# Patient Record
Sex: Male | Born: 1955 | Race: Black or African American | Hispanic: No | Marital: Single | Smoking: Never smoker
Health system: Southern US, Community
[De-identification: ages and names within clinical notes are randomized; demographics above are authoritative.]

## PROBLEM LIST (undated history)

## (undated) DIAGNOSIS — F71 Moderate intellectual disabilities: Secondary | ICD-10-CM

## (undated) DIAGNOSIS — F419 Anxiety disorder, unspecified: Secondary | ICD-10-CM

## (undated) DIAGNOSIS — M6240 Contracture of muscle, unspecified site: Secondary | ICD-10-CM

## (undated) DIAGNOSIS — R1310 Dysphagia, unspecified: Secondary | ICD-10-CM

## (undated) DIAGNOSIS — Z8701 Personal history of pneumonia (recurrent): Secondary | ICD-10-CM

## (undated) DIAGNOSIS — G629 Polyneuropathy, unspecified: Secondary | ICD-10-CM

## (undated) DIAGNOSIS — R41841 Cognitive communication deficit: Secondary | ICD-10-CM

## (undated) DIAGNOSIS — I10 Essential (primary) hypertension: Secondary | ICD-10-CM

## (undated) DIAGNOSIS — G4700 Insomnia, unspecified: Secondary | ICD-10-CM

## (undated) DIAGNOSIS — R293 Abnormal posture: Secondary | ICD-10-CM

---

## 2012-04-13 ENCOUNTER — Ambulatory Visit: Payer: Medicare Other | Attending: Internal Medicine | Admitting: Physical Therapy

## 2012-04-13 DIAGNOSIS — IMO0001 Reserved for inherently not codable concepts without codable children: Secondary | ICD-10-CM | POA: Insufficient documentation

## 2012-04-13 DIAGNOSIS — R262 Difficulty in walking, not elsewhere classified: Secondary | ICD-10-CM | POA: Insufficient documentation

## 2013-04-05 ENCOUNTER — Ambulatory Visit: Payer: Medicare Other | Attending: Internal Medicine | Admitting: Occupational Therapy

## 2013-04-05 DIAGNOSIS — F79 Unspecified intellectual disabilities: Secondary | ICD-10-CM | POA: Insufficient documentation

## 2013-04-05 DIAGNOSIS — R269 Unspecified abnormalities of gait and mobility: Secondary | ICD-10-CM | POA: Insufficient documentation

## 2013-04-05 DIAGNOSIS — IMO0001 Reserved for inherently not codable concepts without codable children: Secondary | ICD-10-CM | POA: Insufficient documentation

## 2013-04-05 DIAGNOSIS — R4189 Other symptoms and signs involving cognitive functions and awareness: Secondary | ICD-10-CM | POA: Insufficient documentation

## 2016-11-07 ENCOUNTER — Other Ambulatory Visit (HOSPITAL_COMMUNITY): Payer: Self-pay | Admitting: Internal Medicine

## 2016-11-07 DIAGNOSIS — I639 Cerebral infarction, unspecified: Secondary | ICD-10-CM

## 2016-11-16 ENCOUNTER — Ambulatory Visit (HOSPITAL_COMMUNITY)
Admission: RE | Admit: 2016-11-16 | Discharge: 2016-11-16 | Disposition: A | Discharge status: Home / Self Care | Payer: Medicare Other | Source: Ambulatory Visit | Attending: Internal Medicine | Admitting: Internal Medicine

## 2016-11-16 DIAGNOSIS — F79 Unspecified intellectual disabilities: Secondary | ICD-10-CM | POA: Diagnosis present

## 2016-11-16 DIAGNOSIS — I639 Cerebral infarction, unspecified: Secondary | ICD-10-CM | POA: Diagnosis not present

## 2016-11-16 DIAGNOSIS — R6889 Other general symptoms and signs: Secondary | ICD-10-CM | POA: Diagnosis present

## 2017-07-11 ENCOUNTER — Emergency Department (HOSPITAL_COMMUNITY): Payer: Medicare Other

## 2017-07-11 ENCOUNTER — Inpatient Hospital Stay (HOSPITAL_COMMUNITY)
Admission: EM | Admit: 2017-07-11 | Discharge: 2017-07-27 | DRG: 870 | Disposition: E | Payer: Medicare Other | Attending: Internal Medicine | Admitting: Internal Medicine

## 2017-07-11 ENCOUNTER — Encounter (HOSPITAL_COMMUNITY): Payer: Self-pay

## 2017-07-11 DIAGNOSIS — J9690 Respiratory failure, unspecified, unspecified whether with hypoxia or hypercapnia: Secondary | ICD-10-CM

## 2017-07-11 DIAGNOSIS — Z7189 Other specified counseling: Secondary | ICD-10-CM

## 2017-07-11 DIAGNOSIS — N390 Urinary tract infection, site not specified: Secondary | ICD-10-CM | POA: Diagnosis not present

## 2017-07-11 DIAGNOSIS — E860 Dehydration: Secondary | ICD-10-CM | POA: Diagnosis present

## 2017-07-11 DIAGNOSIS — A419 Sepsis, unspecified organism: Principal | ICD-10-CM | POA: Diagnosis present

## 2017-07-11 DIAGNOSIS — M24562 Contracture, left knee: Secondary | ICD-10-CM | POA: Diagnosis present

## 2017-07-11 DIAGNOSIS — Z7401 Bed confinement status: Secondary | ICD-10-CM | POA: Diagnosis not present

## 2017-07-11 DIAGNOSIS — F79 Unspecified intellectual disabilities: Secondary | ICD-10-CM | POA: Diagnosis not present

## 2017-07-11 DIAGNOSIS — G809 Cerebral palsy, unspecified: Secondary | ICD-10-CM | POA: Diagnosis present

## 2017-07-11 DIAGNOSIS — E785 Hyperlipidemia, unspecified: Secondary | ICD-10-CM | POA: Diagnosis present

## 2017-07-11 DIAGNOSIS — R400 Somnolence: Secondary | ICD-10-CM | POA: Diagnosis not present

## 2017-07-11 DIAGNOSIS — R5381 Other malaise: Secondary | ICD-10-CM | POA: Diagnosis present

## 2017-07-11 DIAGNOSIS — N179 Acute kidney failure, unspecified: Secondary | ICD-10-CM | POA: Diagnosis present

## 2017-07-11 DIAGNOSIS — R0681 Apnea, not elsewhere classified: Secondary | ICD-10-CM

## 2017-07-11 DIAGNOSIS — R4182 Altered mental status, unspecified: Secondary | ICD-10-CM | POA: Diagnosis present

## 2017-07-11 DIAGNOSIS — R1310 Dysphagia, unspecified: Secondary | ICD-10-CM | POA: Diagnosis present

## 2017-07-11 DIAGNOSIS — G9349 Other encephalopathy: Secondary | ICD-10-CM | POA: Diagnosis present

## 2017-07-11 DIAGNOSIS — Z7982 Long term (current) use of aspirin: Secondary | ICD-10-CM

## 2017-07-11 DIAGNOSIS — M24561 Contracture, right knee: Secondary | ICD-10-CM | POA: Diagnosis present

## 2017-07-11 DIAGNOSIS — Z79899 Other long term (current) drug therapy: Secondary | ICD-10-CM

## 2017-07-11 DIAGNOSIS — R569 Unspecified convulsions: Secondary | ICD-10-CM | POA: Diagnosis present

## 2017-07-11 DIAGNOSIS — E870 Hyperosmolality and hypernatremia: Secondary | ICD-10-CM | POA: Diagnosis present

## 2017-07-11 DIAGNOSIS — I469 Cardiac arrest, cause unspecified: Secondary | ICD-10-CM | POA: Diagnosis not present

## 2017-07-11 DIAGNOSIS — Z515 Encounter for palliative care: Secondary | ICD-10-CM

## 2017-07-11 DIAGNOSIS — J690 Pneumonitis due to inhalation of food and vomit: Secondary | ICD-10-CM | POA: Diagnosis not present

## 2017-07-11 DIAGNOSIS — J9601 Acute respiratory failure with hypoxia: Secondary | ICD-10-CM | POA: Diagnosis not present

## 2017-07-11 DIAGNOSIS — F71 Moderate intellectual disabilities: Secondary | ICD-10-CM | POA: Diagnosis present

## 2017-07-11 DIAGNOSIS — I959 Hypotension, unspecified: Secondary | ICD-10-CM | POA: Diagnosis present

## 2017-07-11 DIAGNOSIS — Z66 Do not resuscitate: Secondary | ICD-10-CM | POA: Diagnosis present

## 2017-07-11 DIAGNOSIS — G259 Extrapyramidal and movement disorder, unspecified: Secondary | ICD-10-CM | POA: Diagnosis present

## 2017-07-11 DIAGNOSIS — I10 Essential (primary) hypertension: Secondary | ICD-10-CM | POA: Diagnosis not present

## 2017-07-11 DIAGNOSIS — F0390 Unspecified dementia without behavioral disturbance: Secondary | ICD-10-CM | POA: Diagnosis present

## 2017-07-11 DIAGNOSIS — I214 Non-ST elevation (NSTEMI) myocardial infarction: Secondary | ICD-10-CM

## 2017-07-11 DIAGNOSIS — R319 Hematuria, unspecified: Secondary | ICD-10-CM | POA: Diagnosis present

## 2017-07-11 DIAGNOSIS — D649 Anemia, unspecified: Secondary | ICD-10-CM | POA: Diagnosis present

## 2017-07-11 DIAGNOSIS — Z8701 Personal history of pneumonia (recurrent): Secondary | ICD-10-CM

## 2017-07-11 DIAGNOSIS — E871 Hypo-osmolality and hyponatremia: Secondary | ICD-10-CM | POA: Diagnosis not present

## 2017-07-11 DIAGNOSIS — Z993 Dependence on wheelchair: Secondary | ICD-10-CM

## 2017-07-11 DIAGNOSIS — R410 Disorientation, unspecified: Secondary | ICD-10-CM

## 2017-07-11 DIAGNOSIS — M6240 Contracture of muscle, unspecified site: Secondary | ICD-10-CM | POA: Diagnosis present

## 2017-07-11 DIAGNOSIS — I639 Cerebral infarction, unspecified: Secondary | ICD-10-CM | POA: Diagnosis present

## 2017-07-11 HISTORY — DX: Polyneuropathy, unspecified: G62.9

## 2017-07-11 HISTORY — DX: Essential (primary) hypertension: I10

## 2017-07-11 HISTORY — DX: Anxiety disorder, unspecified: F41.9

## 2017-07-11 HISTORY — DX: Insomnia, unspecified: G47.00

## 2017-07-11 HISTORY — DX: Personal history of pneumonia (recurrent): Z87.01

## 2017-07-11 HISTORY — DX: Dysphagia, unspecified: R13.10

## 2017-07-11 HISTORY — DX: Cognitive communication deficit: R41.841

## 2017-07-11 HISTORY — DX: Moderate intellectual disabilities: F71

## 2017-07-11 HISTORY — DX: Contracture of muscle, unspecified site: M62.40

## 2017-07-11 HISTORY — DX: Abnormal posture: R29.3

## 2017-07-11 LAB — CBC WITH DIFFERENTIAL/PLATELET
Basophils Absolute: 0 10*3/uL (ref 0.0–0.1)
Basophils Relative: 0 %
Eosinophils Absolute: 0 10*3/uL (ref 0.0–0.7)
Eosinophils Relative: 0 %
HCT: 36.6 % — ABNORMAL LOW (ref 39.0–52.0)
HEMOGLOBIN: 11.6 g/dL — AB (ref 13.0–17.0)
LYMPHS ABS: 0.6 10*3/uL — AB (ref 0.7–4.0)
LYMPHS PCT: 9 %
MCH: 28.3 pg (ref 26.0–34.0)
MCHC: 31.7 g/dL (ref 30.0–36.0)
MCV: 89.3 fL (ref 78.0–100.0)
MONO ABS: 0.5 10*3/uL (ref 0.1–1.0)
MONOS PCT: 7 %
NEUTROS ABS: 5.5 10*3/uL (ref 1.7–7.7)
NEUTROS PCT: 84 %
Platelets: 234 10*3/uL (ref 150–400)
RBC: 4.1 MIL/uL — ABNORMAL LOW (ref 4.22–5.81)
RDW: 14.8 % (ref 11.5–15.5)
WBC: 6.6 10*3/uL (ref 4.0–10.5)

## 2017-07-11 LAB — URINALYSIS, ROUTINE W REFLEX MICROSCOPIC
Bilirubin Urine: NEGATIVE
Glucose, UA: NEGATIVE mg/dL
Ketones, ur: NEGATIVE mg/dL
NITRITE: POSITIVE — AB
PROTEIN: 30 mg/dL — AB
SPECIFIC GRAVITY, URINE: 1.014 (ref 1.005–1.030)
pH: 5 (ref 5.0–8.0)

## 2017-07-11 LAB — LACTIC ACID, PLASMA: LACTIC ACID, VENOUS: 1.1 mmol/L (ref 0.5–1.9)

## 2017-07-11 LAB — HEPATIC FUNCTION PANEL
ALBUMIN: 3.3 g/dL — AB (ref 3.5–5.0)
ALK PHOS: 167 U/L — AB (ref 38–126)
ALT: 58 U/L (ref 17–63)
AST: 36 U/L (ref 15–41)
BILIRUBIN TOTAL: 0.2 mg/dL — AB (ref 0.3–1.2)
Total Protein: 6.8 g/dL (ref 6.5–8.1)

## 2017-07-11 LAB — BASIC METABOLIC PANEL
Anion gap: 9 (ref 5–15)
BUN: 15 mg/dL (ref 6–20)
CHLORIDE: 104 mmol/L (ref 101–111)
CO2: 36 mmol/L — AB (ref 22–32)
Calcium: 9.4 mg/dL (ref 8.9–10.3)
Creatinine, Ser: 0.4 mg/dL — ABNORMAL LOW (ref 0.61–1.24)
GFR calc Af Amer: >60 mL/min (ref >60)
GFR calc non Af Amer: >60 mL/min (ref >60)
GLUCOSE: 145 mg/dL — AB (ref 65–99)
Potassium: 4.3 mmol/L (ref 3.5–5.1)
SODIUM: 149 mmol/L — AB (ref 135–145)

## 2017-07-11 LAB — MRSA PCR SCREENING: MRSA by PCR: NEGATIVE

## 2017-07-11 MED ORDER — SODIUM CHLORIDE 0.9 % IV BOLUS (SEPSIS)
1000.0000 mL | Freq: Once | INTRAVENOUS | Status: AC
  Administered 2017-07-11: 1000 mL via INTRAVENOUS

## 2017-07-11 MED ORDER — DEXTROSE 5 % IV SOLN
1.0000 g | INTRAVENOUS | Status: DC
  Administered 2017-07-12 – 2017-07-13 (×2): 1 g via INTRAVENOUS
  Filled 2017-07-11 (×2): qty 10

## 2017-07-11 MED ORDER — ACETAMINOPHEN 325 MG PO TABS: 650.0000 mg | ORAL_TABLET | Freq: Four times a day (QID) | ORAL | Status: DC | PRN

## 2017-07-11 MED ORDER — ENOXAPARIN SODIUM 40 MG/0.4ML ~~LOC~~ SOLN
40.0000 mg | SUBCUTANEOUS | Status: DC
  Administered 2017-07-11 – 2017-07-13 (×3): 40 mg via SUBCUTANEOUS
  Filled 2017-07-11 (×3): qty 0.4

## 2017-07-11 MED ORDER — CEFTRIAXONE SODIUM 1 G IJ SOLR
1.0000 g | Freq: Once | INTRAMUSCULAR | Status: AC
  Administered 2017-07-11: 1 g via INTRAVENOUS
  Filled 2017-07-11: qty 10

## 2017-07-11 MED ORDER — DOCUSATE SODIUM 100 MG PO CAPS
100.0000 mg | ORAL_CAPSULE | Freq: Two times a day (BID) | ORAL | Status: DC
  Administered 2017-07-15 – 2017-07-17 (×2): 100 mg via ORAL
  Filled 2017-07-11 (×5): qty 1

## 2017-07-11 MED ORDER — POLYETHYLENE GLYCOL 3350 17 G PO PACK: 17.0000 g | PACK | Freq: Every day | ORAL | Status: DC | PRN

## 2017-07-11 MED ORDER — ACETAMINOPHEN 650 MG RE SUPP
650.0000 mg | Freq: Four times a day (QID) | RECTAL | Status: DC | PRN
  Administered 2017-07-14 – 2017-07-18 (×4): 650 mg via RECTAL
  Filled 2017-07-11 (×4): qty 1

## 2017-07-11 MED ORDER — FLEET ENEMA 7-19 GM/118ML RE ENEM: 1.0000 | ENEMA | Freq: Once | RECTAL | Status: DC | PRN

## 2017-07-11 MED ORDER — LISINOPRIL 10 MG PO TABS
20.0000 mg | ORAL_TABLET | Freq: Every day | ORAL | Status: DC
  Administered 2017-07-12: 20 mg via ORAL
  Filled 2017-07-11 (×3): qty 2

## 2017-07-11 MED ORDER — DEXTROSE-NACL 5-0.2 % IV SOLN
INTRAVENOUS | Status: DC
  Administered 2017-07-11 – 2017-07-13 (×6): via INTRAVENOUS

## 2017-07-11 NOTE — ED Notes (Signed)
Dr Verdie Mosher aware of rectal temp

## 2017-07-11 NOTE — H&P (Signed)
[facility] History and Physical  Reginald [name] NFA:[id number] DOB: [birth year] DOA: Jul 18, 2017  Referring physician: Dr Verdie Mosher PCP: Patient, [name] Per   Chief Complaint: AMS  HPI: Reginald [name] is a 61 y.o. male with history of intellectual disability, HTN, dysphagia, muscle contractures, anxiety, PNA and polyneuropathy presented to ED today sent from [facility] nursing facility for lethargy, hypotension and decreased mobility.  Pocketing food per staff.  Had fever at the SNF.  Got Rocepin 1 gm at the SNF this am before coming here.  Also his BP's were high at the SNF.    At baseline pt is reportedly alert and gives one-word responses typically.  In ED pt is somnolent and not responding verbally.  UA shows sig pyuria and hematuria.  CXR is negative and CT head shows nothing acute.  Got 1 L IVF's in the ED.    Patient is awake, makes eye contact, smiles a bit but no verbal responses.  Doesn't follow any commands.  No family here.    No old admissions in EPIC.  Only in Care Everywhere is a dental caries procedure from 04/2014 at [facility].    ROS  n/a   Past Medical History  Past Medical History:  Diagnosis Date  . Abnormal posture   . Anxiety   . Cognitive communication deficit   . Contracture of muscle   . Contracture of muscle   . Dysphagia   . Hypertension   . Insomnia   . Moderate intellectual disabilities   . Pneumonia   . Polyneuropathy    Past Surgical History History reviewed. No pertinent surgical history. Family History No family history on file. Social History  has no tobacco, alcohol, and drug history on file. Allergies No Known Allergies Home medications Prior to Admission medications   Medication Sig Start Date End Date Taking? Authorizing Provider  ALPRAZolam Prudy Feeler) 0.25 MG tablet  18-Jul-2017   [provider]  baclofen (LIORESAL) 10 MG tablet Take 10 mg by mouth 3 (three) times daily. 06/18/17   [provider]  busPIRone (BUSPAR)  7.5 MG tablet Take 7.5 mg by mouth 2 (two) times daily. 06/15/17   [provider]  gabapentin (NEURONTIN) 600 MG tablet Take 600 mg by mouth daily. 06/05/17   [provider]  HYDROcodone-acetaminophen [facility]) 10-325 MG tablet  06/09/17   [provider]  lisinopril (PRINIVIL,ZESTRIL) 20 MG tablet Take 20 mg by mouth daily. 06/26/17   [provider]  tobramycin (TOBREX) 0.3 % ophthalmic solution 1 drop.  06/21/17   [provider]   Liver Function Tests  Recent Labs Lab 07/18/2017 1620  AST 36  ALT 58  ALKPHOS 167*  BILITOT 0.2*  PROT 6.8  ALBUMIN 3.3*   No results for input(s): LIPASE, AMYLASE in the last 168 hours. CBC  Recent Labs Lab July 18, 2017 1620  WBC 6.6  NEUTROABS 5.5  HGB 11.6*  HCT 36.6*  MCV 89.3  PLT 234   Basic Metabolic Panel  Recent Labs Lab 07-18-2017 1620  NA 149*  K 4.3  CL 104  CO2 36*  GLUCOSE 145*  BUN 15  CREATININE 0.40*  CALCIUM 9.4     Vitals:   07/18/17 1659 July 18, 2017 1700 07/18/2017 1730 07/18/2017 1834  BP:  (!) 166/81 (!) 167/92   Pulse:  92 70   Resp:  19 19   Temp:    (!) 95.4 F (35.2 C)  TempSrc:    Rectal  SpO2: 92% 94% 96%   Weight:  Exam: Gen awake, makes eye contact, repetitive mouth movements w/o verbalization, does not folllow commands No rash, cyanosis or gangrene Sclera anicteric, throat clear and quite dry  No jvd or bruits, flat neck veins, no LAN Chest clear bilat ant and post RRR no MRG Abd soft ntnd no mass or ascites +bs GU normal male, in diapers MS bilat LE contractures and L hand contracted Ext skin looks good, no decub ulcers, no LE or UE edema / no wounds Neuro is alert, as above    Home meds: -baclofen 10 tid / buspar 7.5 bid / neurontin 600 qd / norco prn/  xanax 0.25 prn -lisinopril 20 qd   Na 149  K 4.3  CO2 36  BUN 15   Cr 0.40   AG 9   Ca 9.4  Alb 3.3   LFT's ok    WBC 6k  Hb 11  plt 234 UA > hazy, 6-30 rbc/ tntc WBC/ wbc clumps/ bact few / few  epis  Head CT >  Minimal small vessel chronic ischemic changes of deep cerebral white matter.  No acute intracranial abnormalities.  No significant interval change.  CXR (independ reviewed) > minimal R base atelectasis.  I reviewed the images personally.        Assessment: 1. Altered mental status , acute on chronic - due to dehydration and UTI, possibly meds as well (multiple potentially sedating meds on med list).  Hold sedating meds, IV abx, IVF's.   2. UTI - urine Cx sent, cont IV Rocephin 3. Dehydration - IVF"s 4. Mentally retarded - no good history in chart. Reportedly speaks one word at a time at the most at facility 5. Debility - bilat LE contractures, skin in good condition 6. NOK - called guardian but it is after hours and nobody answered 7. HTN - cont acei   Plan - as above      Reginald [name] D [facility] Pager [phone]   If 7PM-7AM, please contact night-coverage [email or web address] Password [email or web address] 06/30/2017, 6:37 PM

## 2017-07-11 NOTE — ED Notes (Signed)
Attempted to call report. Was advised nurse receiving pt will call this nurse not available.

## 2017-07-11 NOTE — ED Triage Notes (Signed)
PT from [facility].  Sent to er today for lethargy, hypertension, and decreased mobility.  Reports staff noted pt has been pocketing food.  Reports temp 99.  Pt had 2 g of rocephin IM this morning and clonidine for bp 200/88.

## 2017-07-11 NOTE — H&P (Signed)
[facility] History and Physical  Reginald [name] ZOX:[id number] DOB: 12-17-1955 DOA: 07/17/2017  Referring physician: Dr Verdie Mosher PCP: Patient, [name] Per   Chief Complaint: AMS  HPI: Reginald [name] is a 61 y.o. male with history of intellectual disability, HTN, dysphagia, muscle contractures, anxiety, PNA and polyneuropathy presented to ED today sent from [facility] nursing facility for lethargy, hypotension and decreased mobility.  Pocketing food per staff.  Had fever at the SNF.  Got Rocepin 1 gm at the SNF this am before coming here.  Also his BP's were high at the SNF.    At baseline pt is reportedly alert and gives one-word responses typically.  In ED pt is somnolent and not responding verbally.  UA shows sig pyuria and hematuria.  CXR is negative and CT head shows nothing acute.  Got 1 L IVF's in the ED.    Patient is awake, makes eye contact, smiles a bit but no verbal responses.  Doesn't follow any commands.  No family here.    No old admissions in EPIC.  Only in Care Everywhere is a dental caries procedure from 04/2014 at [facility].    ROS  n/a   Past Medical History  Past Medical History:  Diagnosis Date  . Abnormal posture   . Anxiety   . Cognitive communication deficit   . Contracture of muscle   . Contracture of muscle   . Dysphagia   . Hypertension   . Insomnia   . Moderate intellectual disabilities   . Pneumonia   . Polyneuropathy    Past Surgical History History reviewed. No pertinent surgical history. Family History No family history on file. Social History  has no tobacco, alcohol, and drug history on file. Allergies No Known Allergies Home medications Prior to Admission medications   Medication Sig Start Date End Date Taking? Authorizing Provider  ALPRAZolam Prudy Feeler) 0.25 MG tablet  07/04/2017   [provider]  baclofen (LIORESAL) 10 MG tablet Take 10 mg by mouth 3 (three) times daily. 06/18/17   [provider]  busPIRone (BUSPAR)  7.5 MG tablet Take 7.5 mg by mouth 2 (two) times daily. 06/15/17   [provider]  gabapentin (NEURONTIN) 600 MG tablet Take 600 mg by mouth daily. 06/05/17   [provider]  HYDROcodone-acetaminophen [facility]) 10-325 MG tablet  06/09/17   [provider]  lisinopril (PRINIVIL,ZESTRIL) 20 MG tablet Take 20 mg by mouth daily. 06/26/17   [provider]  tobramycin (TOBREX) 0.3 % ophthalmic solution 1 drop.  06/21/17   [provider]   Liver Function Tests  Recent Labs Lab 07/09/2017 1620  AST 36  ALT 58  ALKPHOS 167*  BILITOT 0.2*  PROT 6.8  ALBUMIN 3.3*   No results for input(s): LIPASE, AMYLASE in the last 168 hours. CBC  Recent Labs Lab 07/09/2017 1620  WBC 6.6  NEUTROABS 5.5  HGB 11.6*  HCT 36.6*  MCV 89.3  PLT 234   Basic Metabolic Panel  Recent Labs Lab 07/22/2017 1620  NA 149*  K 4.3  CL 104  CO2 36*  GLUCOSE 145*  BUN 15  CREATININE 0.40*  CALCIUM 9.4     Vitals:   07/25/2017 1630 07/08/2017 1659 07/23/2017 1700 07/18/2017 1730  BP: (!) 177/85  (!) 166/81 (!) 167/92  Pulse: 88  92 70  Resp: (!) SpO2: 91% 92% 94% 96%  Weight:       Exam: Gen awake, makes eye contact, repetitive mouth  movements w/o verbalization, does not folllow commands No rash, cyanosis or gangrene Sclera anicteric, throat clear and quite dry  No jvd or bruits, flat neck veins, no LAN Chest clear bilat ant and post RRR no MRG Abd soft ntnd no mass or ascites +bs GU normal male, in diapers MS bilat LE contractures and L hand contracted Ext skin looks good, no decub ulcers, no LE or UE edema / no wounds Neuro is alert, as above    Home meds: -baclofen 10 tid / buspar 7.5 bid / neurontin 600 qd / norco prn/  xanax 0.25 prn -lisinopril 20 qd   Na 149  K 4.3  CO2 36  BUN 15   Cr 0.40   AG 9   Ca 9.4  Alb 3.3   LFT's ok    WBC 6k  Hb 11  plt 234 UA > hazy, 6-30 rbc/ tntc WBC/ wbc clumps/ bact few / few epis  Head CT >  Minimal  small vessel chronic ischemic changes of deep cerebral white matter.  No acute intracranial abnormalities.  No significant interval change.  CXR (independ reviewed) > minimal R base atelectasis.  I reviewed the images personally.        Assessment: 1. Altered mental status , acute on chronic - due to dehydration and UTI, possibly meds as well (multiple potentially sedating meds on med list).  Hold sedating meds, IV abx, IVF's.   2. UTI - urine Cx sent, cont IV Rocephin 3. Dehydration - IVF"s 4. Mentally retarded - no good history in chart. Reportedly speaks one word at a time at the most at facility 5. Debility - bilat LE contractures, skin in good condition 6. NOK - called guardian but it is after hours and nobody answered 7.   Plan - as above      Avionna Bower D [facility] Pager [phone]   If 7PM-7AM, please contact night-coverage [email or web address] Password [email or web address] Aug 05, 2017, 6:15 PM

## 2017-07-11 NOTE — ED Notes (Signed)
New diaper put on Pt

## 2017-07-11 NOTE — ED Notes (Signed)
Call from guardian checking on pt. Advised pt being admitted to hospital. Per guardian pt can speak complete sentences if he likes you but will not speak to you if he does not know you. Pt only making verbal contact w/ this nurse.

## 2017-07-11 NOTE — ED Notes (Signed)
Spoke w/ Dennie Bible at [facility] about Pt admission & normal baseline.

## 2017-07-11 NOTE — ED Notes (Signed)
Attempted to call report. Was advised nurse receiving pt will call this nurse back for report. 

## 2017-07-11 NOTE — ED Provider Notes (Signed)
[facility] MEDICAL SURGICAL UNIT Provider Note   CSN: [id number] Arrival date & time: 07/08/2017  1612     History   Chief Complaint Chief Complaint  Patient presents with  . Fatigue    HPI Reginald [name] is a 61 y.o. male.  HPI Level 5 caveat due to altered mental status 61 year old male who presents with lethargy. From [facility]. History of cognitive impairment, dementia. Noted by staff to be lethargic today with low grade temperature of 99. He has been pocketing food today which is new. Given IM rocephin empirically. Also hypertensive SBP 200s and given clonidine. Patient non-verbal, unable to provide history.   Past Medical History:  Diagnosis Date  . Abnormal posture   . Anxiety   . Cognitive communication deficit   . Contracture of muscle   . Contracture of muscle   . Dysphagia   . Hypertension   . Insomnia   . Moderate intellectual disabilities   . Pneumonia   . Polyneuropathy     Patient Active Problem List   Diagnosis Date Noted  . Intellectual disability 06/30/2017  . Altered mental status 07/22/2017  . Acute lower UTI 06/27/2017  . Dehydration 07/12/2017  . Contractures of both knees 06/27/2017  . Debility 07/20/2017  . Bedbound 07/10/2017  . Essential hypertension 07/12/2017    History reviewed. No pertinent surgical history.     Home Medications    Prior to Admission medications   Medication Sig Start Date End Date Taking? Authorizing Provider  acetaminophen (TYLENOL) 325 MG tablet Take 650 mg by mouth 3 (three) times daily.   Yes [provider]  acetaminophen (TYLENOL) 500 MG tablet Take 1,000 mg by mouth every 6 (six) hours as needed for mild pain or moderate pain.   Yes [provider]  ALPRAZolam (XANAX) 0.25 MG tablet Take 0.25 mg by mouth 2 (two) times daily.  07/10/2017  Yes [provider]  aspirin EC 81 MG tablet Take 81 mg by mouth daily.   Yes [provider]  baclofen  (LIORESAL) 10 MG tablet Take 10 mg by mouth 3 (three) times daily. 06/18/17  Yes [provider]  busPIRone (BUSPAR) 7.5 MG tablet Take 7.5 mg by mouth 2 (two) times daily. 06/15/17  Yes [provider]  cefTRIAXone (ROCEPHIN) 1 g injection Inject 2 g into the muscle once.   Yes [provider]  chlorhexidine (PERIDEX) 0.12 % solution Use as directed 15 mLs in the mouth or throat 2 (two) times daily.   Yes [provider]  cholecalciferol (VITAMIN D) 1000 units tablet Take 1,000 Units by mouth daily.   Yes [provider]  cloNIDine (CATAPRES) 0.2 MG tablet Take 0.2 mg by mouth once.   Yes [provider]  gabapentin (NEURONTIN) 600 MG tablet Take 600 mg by mouth at bedtime.  06/05/17  Yes [provider]  HYDROcodone-acetaminophen (NORCO) 10-325 MG tablet Take 1 tablet by mouth 3 (three) times daily.  06/09/17  Yes [provider]  lisinopril (PRINIVIL,ZESTRIL) 20 MG tablet Take 20 mg by mouth daily. 06/26/17  Yes [provider]  Melatonin 3 MG TABS Take 3 mg by mouth at bedtime.   Yes [provider]  Multiple Vitamin (MULTIVITAMIN WITH MINERALS) TABS tablet Take 1 tablet by mouth daily.   Yes [provider]  OXYGEN Inhale into the lungs daily.   Yes [provider]    Family History No family history on file.  Social History  Social History  Substance Use Topics  . Smoking status: Not on file  . Smokeless tobacco: Not on file  . Alcohol use Not on file     Allergies   Patient has no known allergies.   Review of Systems Review of Systems  Unable to perform ROS: Patient nonverbal     Physical Exam Updated Vital Signs BP (!) (P) 190/72 (BP Location: Left Arm)   Pulse (P) 81   Temp (!) 97.1 F (36.2 C)   Resp (P) 18   Wt (P) 54.7 kg (120 lb 11.2 oz)   SpO2 (P) 99%   Physical Exam Physical Exam  Nursing note and vitals reviewed. Constitutional: Lethargy, acutely ill  appearing, and in no acute distress Head: Normocephalic and atraumatic.  Mouth/Throat: Oropharynx is clear and dry.  Neck: Normal range of motion. Neck supple.  Cardiovascular: Normal rate and regular rhythm.   Pulmonary/Chest: Effort normal and breath sounds normal anteriorly.  Abdominal: Soft. There is no tenderness. There is no rebound and no guarding.  Musculoskeletal: No deformities. No edema Neurological: Lethargic, somnolent, does not obey commands Skin: Skin is warm and dry.  Psychiatric: Cooperative   ED Treatments / Results  Labs (all labs ordered are listed, but only abnormal results are displayed) Labs Reviewed  CBC WITH DIFFERENTIAL/PLATELET - Abnormal; Notable for the following:       Result Value   RBC 4.10 (*)    Hemoglobin 11.6 (*)    HCT 36.6 (*)    Lymphs Abs 0.6 (*)    All other components within normal limits  BASIC METABOLIC PANEL - Abnormal; Notable for the following:    Sodium 149 (*)    CO2 36 (*)    Glucose, Bld 145 (*)    Creatinine, Ser 0.40 (*)    All other components within normal limits  HEPATIC FUNCTION PANEL - Abnormal; Notable for the following:    Albumin 3.3 (*)    Alkaline Phosphatase 167 (*)    Total Bilirubin 0.2 (*)    Bilirubin, Direct <0.1 (*)    All other components within normal limits  URINALYSIS, ROUTINE W REFLEX MICROSCOPIC - Abnormal; Notable for the following:    APPearance HAZY (*)    Hgb urine dipstick MODERATE (*)    Protein, ur 30 (*)    Nitrite POSITIVE (*)    Leukocytes, UA LARGE (*)    Bacteria, UA FEW (*)    Squamous Epithelial / LPF 0-5 (*)    All other components within normal limits  URINE CULTURE  MRSA PCR SCREENING  LACTIC ACID, PLASMA  HIV ANTIBODY (ROUTINE TESTING)  BASIC METABOLIC PANEL  CBC    EKG  EKG Interpretation  Date/Time:  Tuesday July 21, 2017 16:16:24 EDT Ventricular Rate:  84 PR Interval:    QRS Duration: 103 QT Interval:  391 QTC Calculation: 463 R Axis:   111 Text  Interpretation:  Sinus rhythm Right axis deviation no prior EKG  Confirmed by Crista Curb [phone]) on July 21, 2017 5:05:17 PM       Radiology Ct Head Wo Contrast  Result Date: 07/02/2017 CLINICAL DATA:  Lethargy, hypertension, low-grade fever, decreased mobility EXAM: CT HEAD WITHOUT CONTRAST TECHNIQUE: Contiguous axial images were obtained from the base of the skull through the vertex without intravenous contrast. Sagittal and coronal MPR images reconstructed from axial data set. COMPARISON:  11/16/2016 FINDINGS: Brain: Normal ventricular morphology. No midline shift or mass effect. Minimal small vessel chronic ischemic changes of deep cerebral white matter. Otherwise  normal appearance of brain parenchyma. No intracranial hemorrhage, mass lesion or evidence of acute infarction. No extra-axial fluid collections. Vascular: Unremarkable Skull: Intact Sinuses/Orbits: Cleared Other: N/A IMPRESSION: Minimal small vessel chronic ischemic changes of deep cerebral white matter. No acute intracranial abnormalities. No significant interval change. Electronically Signed   By: Ulyses Southward M.D.   On: 07/04/2017 17:23   Dg Chest Portable 1 View  Result Date: 06/29/2017 CLINICAL DATA:  Low-grade fever, lethargy, hypertension, decreased mobility EXAM: PORTABLE CHEST 1 VIEW COMPARISON:  Portable exam 1623 hours without priors for comparison FINDINGS: Normal heart size, mediastinal contours, and pulmonary vascularity. Azygos fissure noted. Minimal RIGHT basilar atelectasis. Lungs otherwise clear. No infiltrate, pleural effusion or pneumothorax. Bones unremarkable. IMPRESSION: Minimal RIGHT basilar atelectasis. Electronically Signed   By: Ulyses Southward M.D.   On: 07/20/2017 16:40    Procedures Procedures (including critical care time)  Medications Ordered in ED Medications  lisinopril (PRINIVIL,ZESTRIL) tablet 20 mg (not administered)  enoxaparin (LOVENOX) injection 40 mg (not administered)  dextrose 5 % and 0.2 %  NaCl infusion ( Intravenous New Bag/Given 06/28/2017 2318)  acetaminophen (TYLENOL) tablet 650 mg (not administered)    Or  acetaminophen (TYLENOL) suppository 650 mg (not administered)  docusate sodium (COLACE) capsule 100 mg (100 mg Oral Not Given 07/23/2017 2215)  polyethylene glycol (MIRALAX / GLYCOLAX) packet 17 g (not administered)  sodium phosphate (FLEET) 7-19 GM/118ML enema 1 enema (not administered)  cefTRIAXone (ROCEPHIN) 1 g in dextrose 5 % 50 mL IVPB (not administered)  sodium chloride 0.9 % bolus 1,000 mL (0 mLs Intravenous Stopped 07/25/2017 1944)  cefTRIAXone (ROCEPHIN) 1 g in dextrose 5 % 50 mL IVPB (0 g Intravenous Stopped 07/26/2017 1903)     Initial Impression / Assessment and Plan / ED Course  I have reviewed the triage vital signs and the nursing notes.  Pertinent labs & imaging results that were available during my care of the patient were reviewed by me and considered in my medical decision making (see chart for details).     Somnolent, lethargic on presentation. Non-verbal and Not obeying commands. Per nursing facility he is normally alert and able to speak in one-word sentences.  He is afebrile here. Hemodynamically is stable.  Workup reveals evidence of a UTI. Urine is sent for culture. He is given IV ceftriaxone and IV fluids.  No leukocytosis or elevated lactic acid. Visualized chest x-ray shows no obvious pneumonia. CT head visualized and shows no acute intracranial processes.  Given mental status changes, will admit for ongoing treatment for his UTI. Discussed with Dr. Arta Silence  Final Clinical Impressions(s) / ED Diagnoses   Final diagnoses:  Lower urinary tract infectious disease  Delirium    New Prescriptions Current Discharge Medication List       Lavera Guise, MD 07/09/2017 2327

## 2017-07-12 ENCOUNTER — Encounter (HOSPITAL_COMMUNITY): Payer: Self-pay

## 2017-07-12 DIAGNOSIS — N390 Urinary tract infection, site not specified: Secondary | ICD-10-CM | POA: Diagnosis not present

## 2017-07-12 DIAGNOSIS — R5381 Other malaise: Secondary | ICD-10-CM | POA: Diagnosis not present

## 2017-07-12 DIAGNOSIS — I10 Essential (primary) hypertension: Secondary | ICD-10-CM | POA: Diagnosis not present

## 2017-07-12 DIAGNOSIS — F79 Unspecified intellectual disabilities: Secondary | ICD-10-CM | POA: Diagnosis not present

## 2017-07-12 DIAGNOSIS — M24562 Contracture, left knee: Secondary | ICD-10-CM | POA: Diagnosis not present

## 2017-07-12 DIAGNOSIS — M24561 Contracture, right knee: Secondary | ICD-10-CM | POA: Diagnosis not present

## 2017-07-12 DIAGNOSIS — R400 Somnolence: Secondary | ICD-10-CM | POA: Diagnosis not present

## 2017-07-12 DIAGNOSIS — E860 Dehydration: Secondary | ICD-10-CM | POA: Diagnosis not present

## 2017-07-12 DIAGNOSIS — Z7401 Bed confinement status: Secondary | ICD-10-CM | POA: Diagnosis not present

## 2017-07-12 LAB — BASIC METABOLIC PANEL
ANION GAP: 7 (ref 5–15)
BUN: 10 mg/dL (ref 6–20)
CALCIUM: 9.1 mg/dL (ref 8.9–10.3)
CO2: 36 mmol/L — AB (ref 22–32)
CREATININE: 0.32 mg/dL — AB (ref 0.61–1.24)
Chloride: 100 mmol/L — ABNORMAL LOW (ref 101–111)
GFR calc non Af Amer: >60 mL/min (ref >60)
Glucose, Bld: 174 mg/dL — ABNORMAL HIGH (ref 65–99)
Potassium: 4.1 mmol/L (ref 3.5–5.1)
SODIUM: 143 mmol/L (ref 135–145)

## 2017-07-12 LAB — CBC
HCT: 35.6 % — ABNORMAL LOW (ref 39.0–52.0)
HEMOGLOBIN: 11.3 g/dL — AB (ref 13.0–17.0)
MCH: 28.4 pg (ref 26.0–34.0)
MCHC: 31.7 g/dL (ref 30.0–36.0)
MCV: 89.4 fL (ref 78.0–100.0)
PLATELETS: 259 10*3/uL (ref 150–400)
RBC: 3.98 MIL/uL — AB (ref 4.22–5.81)
RDW: 14.6 % (ref 11.5–15.5)
WBC: 8.4 10*3/uL (ref 4.0–10.5)

## 2017-07-12 MED ORDER — POLYVINYL ALCOHOL 1.4 % OP SOLN
1.0000 [drp] | OPHTHALMIC | Status: DC | PRN
  Administered 2017-07-12: 1 [drp] via OPHTHALMIC
  Filled 2017-07-12: qty 15

## 2017-07-12 NOTE — Care Management Note (Signed)
Case Management Note  Patient Details  Name: Halina MaidensLonnie Riecke MRN: [id number] Date of Birth: 01-18-1956  Subjective/Objective:                  AMS d/t UTI. [facility] CM contacted [facility] rep, carolyn. Pt is long term under medicaid. Has guardian Barnie AldermanMelissa Price [phone] ext [phone].   Action/Plan: Pt will DC back to [facility] when ready, CSW will make arrangements for return to facility.   Expected Discharge Date:      07/12/2017            Expected Discharge Plan:  Skilled Nursing Facility  In-House Referral:  Clinical Social Work  Discharge planning Services  CM Consult  Post Acute Care Choice:  NA Choice offered to:  NA  Status of Service:  Completed, signed off  Malcolm MetroChildress, Briselda Naval Demske, RN 07/12/2017, 8:54 AM

## 2017-07-12 NOTE — Evaluation (Signed)
Clinical/Bedside Swallow Evaluation Patient Details  Name: Reginald [name] MRN: [id number] Date of Birth: 1956-07-22  Today's Date: 07/12/2017 Time: SLP Start Time (ACUTE ONLY): 1341 SLP Stop Time (ACUTE ONLY): 1410 SLP Time Calculation (min) (ACUTE ONLY): 29 min  Past Medical History:  Past Medical History:  Diagnosis Date  . Abnormal posture   . Anxiety   . Cognitive communication deficit   . Contracture of muscle   . Contracture of muscle   . Dysphagia   . Hypertension   . Insomnia   . Moderate intellectual disabilities   . Pneumonia   . Polyneuropathy    Past Surgical History: History reviewed. No pertinent surgical history. HPI:  Reginald [name] is a 61 y.o. male with history of intellectual disability, HTN, dysphagia, muscle contractures, anxiety, PNA and polyneuropathy presented to ED today sent from [facility] nursing facility for lethargy, hypotension and decreased mobility.  Pocketing food per staff.  Had fever at the SNF.  Got Rocepin 1 gm at the SNF this am before coming here.  Also his BP's were high at the SNF.    Assessment / Plan / Recommendation Clinical Impression  Clinical swallow evaluation completed at bedside. Pt essentially nonverbal and did not follow simple commands with multimodality cues. Pt did nod his head occasionally and verbalized, "yeah" x1. Pt frequently opens and closes his eyes (turned inward) and has spontaneous head movements. Pt unable to cough, swallow, or protrude tongue despite model (although he smiles some). Pt accepted ice chips and po trials readily, but with moderate oral phase characterized by impaired oral control-Pt gulps, gasps, holds bolus, and eventually swallows with an audible swallow. No coughing or wet breath appreciated and breathing remained unchanged over the course of my visit. Pt had increased difficulty sucking NTL from the straw. Will initiate D1/puree with thin liquids with 100% feeder assist when Pt is alert and upright. PO  meds whole or crushed as able in puree. SLP called [facility] and communicated with dietary and SLP who confirmed that Pt's baseline diet is regular and thin. Pt able to help with self feeding and had no signs of oral dysphagia. Today, Pt appears to be quite different from his baseline. SLP will follow during acute stay and recommend f/u SLP at SNF upon discharge.   SLP Visit Diagnosis: Dysphagia, oropharyngeal phase (R13.12)    Aspiration Risk  Moderate aspiration risk    Diet Recommendation Dysphagia 1 (Puree);Thin liquid   Liquid Administration via: Cup;Straw Medication Administration: Whole meds with puree Supervision: Staff to assist with self feeding;Full supervision/cueing for compensatory strategies Compensations: Slow rate;Small sips/bites Postural Changes: Seated upright at 90 degrees;Remain upright for at least 30 minutes after po intake    Other  Recommendations Oral Care Recommendations: Oral care BID;Staff/trained caregiver to provide oral care Other Recommendations: Clarify dietary restrictions   Follow up Recommendations Skilled Nursing facility      Frequency and Duration min 2x/week  1 week       Prognosis Prognosis for Safe Diet Advancement: Fair Barriers to Reach Goals: Cognitive deficits      Swallow Study   General Date of Onset: 07/20/2017 HPI: Reginald [name] is a 61 y.o. male with history of intellectual disability, HTN, dysphagia, muscle contractures, anxiety, PNA and polyneuropathy presented to ED today sent from [facility] nursing facility for lethargy, hypotension and decreased mobility.  Pocketing food per staff.  Had fever at the SNF.  Got Rocepin 1 gm at the SNF this am before coming here.  Also  his BP's were high at the SNF.  Type of Study: Bedside Swallow Evaluation Previous Swallow Assessment: None on record Diet Prior to this Study: NPO ([facility] Temperature Spikes Noted: No Respiratory Status: Room  air History of Recent Intubation: No Behavior/Cognition: Lethargic/Drowsy Oral Cavity Assessment:  (difficult to assess) Oral Care Completed by SLP: No Oral Cavity - Dentition: Poor condition Vision: Impaired for self-feeding Self-Feeding Abilities: Total assist Patient Positioning: Upright in bed Baseline Vocal Quality:  (Pt sounds clear, essentially nonverbal with exception "yeah") Volitional Cough: Cognitively unable to elicit Volitional Swallow: Unable to elicit    Oral/Motor/Sensory Function Overall Oral Motor/Sensory Function: Mild impairment (difficult to assess)   Ice Chips Ice chips: Within functional limits Presentation: Spoon   Thin Liquid Thin Liquid: Impaired Presentation: Cup;Straw Oral Phase Impairments: Reduced lingual movement/coordination Oral Phase Functional Implications: Prolonged oral transit;Oral holding Pharyngeal  Phase Impairments: Suspected delayed Swallow;Multiple swallows    Nectar Thick Nectar Thick Liquid: Impaired Presentation: Straw Oral Phase Impairments:  (difficult sucking from straw)   Honey Thick Honey Thick Liquid: Not tested   Puree Puree: Within functional limits Presentation: Spoon   Solid   GO   Solid: Not tested    Functional Assessment Tool Used: clinical judgment Functional Limitations: Swallowing Swallow Current Status ([id number](G8996): At least 40 percent but less than 60 percent impaired, limited or restricted Swallow Goal Status [id number]): At least 20 percent but less than 40 percent impaired, limited or restricted  Thank you,  Havery MorosDabney Cato Liburd, CCC-SLP [phone]  Javier Gell 07/12/2017,2:21 PM

## 2017-07-12 NOTE — Progress Notes (Signed)
PROGRESS NOTE    Reginald [name]  OZD:[id number] DOB: [birth year] DOA: 05-Aug-2017 PCP: Patient, [name] Per    Brief Narrative:  Reginald [name] is a 61 y.o. male with history of intellectual disability, HTN, dysphagia, muscle contractures, anxiety, PNA and polyneuropathy presented to ED today sent from [facility] nursing facility for lethargy, hypotension and decreased mobility.  Pocketing food per staff.  Had fever at the SNF.  Got Rocepin 1 gm at the SNF this am before coming here.  Also his BP's were high at the SNF.    At baseline pt is reportedly alert and gives one-word responses typically.  In ED pt is somnolent and not responding verbally.  UA shows sig pyuria and hematuria.  CXR is negative and CT head shows nothing acute.  Got 1 L IVF's in the ED.    Patient is awake, makes eye contact, smiles a bit but no verbal responses.  Doesn't follow any commands.  No family here.    No old admissions in EPIC.  Only in Care Everywhere is a dental caries procedure from 04/2014 at [facility].  Started on Ceftriaxone.   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  Noncommunicative so could not assess status.  Objective: Vitals:   Aug 05, 2017 2027 2017-08-05 2252 08-05-17 2300 07/12/17 0641  BP:  (!) 190/72  (!) 183/75  Pulse:  81  74  Resp:  18  18  Temp: (!) 96.1 F (35.6 C)  (!) 97.1 F (36.2 C) 97.9 F (36.6 C)  TempSrc: Rectal Rectal  Axillary  SpO2:  99%  95%  Weight:  54.7 kg (120 lb 11.2 oz)  54.4 kg (120 lb)  Height:    5\' 5"  (1.651 m)    Intake/Output Summary (Last 24 hours) at 07/12/17 1326 Last data filed at 07/12/17 0644  Gross per 24 hour  Intake             1605 ml  Output              575 ml  Net             1030 ml   Filed Weights   Aug 05, 2017 1610 08-05-2017 2252 07/12/17 0641  Weight: 54.4 kg (120 lb) 54.7 kg (120 lb 11.2 oz) 54.4 kg (120 lb)    Examination:  General exam: Appears calm and comfortable  Respiratory system: Clear  to auscultation. Respiratory effort normal. Cardiovascular system: S1 & S2 heard, RRR. No JVD, murmurs, rubs, gallops or clicks. No pedal edema. Gastrointestinal system: Abdomen is nondistended, soft and nontender. No organomegaly or masses felt. Normal bowel sounds heard. Central nervous system: Not able to assess Extremities: Contracted upper and lower extremities. Skin: No rashes, lesions or ulcers Psychiatry: Not able to assess     Data Reviewed: I have personally reviewed following labs and imaging studies  CBC:  Recent Labs Lab 07/16/2017 1620 07/12/17 0421  WBC 6.6 8.4  NEUTROABS 5.5  --   HGB 11.6* 11.3*  HCT 36.6* 35.6*  MCV 89.3 89.4  PLT 234 259   Basic Metabolic Panel:  Recent Labs Lab 07/20/2017 1620 07/12/17 0421  NA 149* 143  K 4.3 4.1  CL 104 100*  CO2 36* 36*  GLUCOSE 145* 174*  BUN 15 10  CREATININE 0.40* 0.32*  CALCIUM 9.4 9.1   GFR: Estimated Creatinine Clearance: 75.6 mL/min (A) (by C-G formula  based on SCr of 0.32 mg/dL (L)). Liver Function Tests:  Recent Labs Lab 07/20/2017 1620  AST 36  ALT 58  ALKPHOS 167*  BILITOT 0.2*  PROT 6.8  ALBUMIN 3.3*   No results for input(s): LIPASE, AMYLASE in the last 168 hours. No results for input(s): AMMONIA in the last 168 hours. Coagulation Profile: No results for input(s): INR, PROTIME in the last 168 hours. Cardiac Enzymes: No results for input(s): CKTOTAL, CKMB, CKMBINDEX, TROPONINI in the last 168 hours. BNP (last 3 results) No results for input(s): PROBNP in the last 8760 hours. HbA1C: No results for input(s): HGBA1C in the last 72 hours. CBG: No results for input(s): GLUCAP in the last 168 hours. Lipid Profile: No results for input(s): CHOL, HDL, LDLCALC, TRIG, CHOLHDL, LDLDIRECT in the last 72 hours. Thyroid Function Tests: No results for input(s): TSH, T4TOTAL, FREET4, T3FREE, THYROIDAB in the last 72 hours. Anemia Panel: No results for input(s): VITAMINB12, FOLATE, FERRITIN, TIBC, IRON, RETICCTPCT in the last 72 hours. Sepsis Labs:  Recent Labs Lab 07/07/2017 1625  LATICACIDVEN 1.1    Recent Results (from the past 240 hour(s))  MRSA PCR Screening     Status: None   Collection Time: 07/25/2017 10:05 PM  Result Value Ref Range Status   MRSA by PCR NEGATIVE NEGATIVE Final    Comment:        The GeneXpert MRSA Assay (FDA approved for NASAL specimens only), is one component of a comprehensive MRSA colonization surveillance program. It is not intended to diagnose MRSA infection nor to guide or monitor treatment for MRSA infections.          Radiology Studies: Ct Head Wo Contrast  Result Date: 07/09/2017 CLINICAL DATA:  Lethargy, hypertension, low-grade fever, decreased mobility EXAM: CT HEAD WITHOUT CONTRAST TECHNIQUE: Contiguous axial images were obtained from the base of the skull through the vertex without intravenous contrast. Sagittal and coronal MPR images reconstructed from axial data set.  COMPARISON:  11/16/2016 FINDINGS: Brain: Normal ventricular morphology. No midline shift or mass effect. Minimal small vessel chronic ischemic changes of deep cerebral white matter. Otherwise normal appearance of brain parenchyma. No intracranial hemorrhage, mass lesion or evidence of acute infarction. No extra-axial fluid collections. Vascular: Unremarkable Skull: Intact Sinuses/Orbits: Cleared Other: N/A IMPRESSION: Minimal small vessel chronic ischemic changes of deep cerebral white matter. No acute intracranial abnormalities. No significant interval change. Electronically Signed   By: Ulyses Southward M.D.   On: 07/20/2017 17:23   Dg Chest Portable 1 View  Result Date: 01-24-2017 CLINICAL DATA:  Low-grade fever, lethargy, hypertension, decreased mobility EXAM: PORTABLE CHEST 1 VIEW COMPARISON:  Portable exam 1623 hours without priors for comparison FINDINGS: Normal heart size, mediastinal contours, and pulmonary vascularity. Azygos fissure noted. Minimal RIGHT basilar atelectasis. Lungs otherwise clear. No infiltrate, pleural effusion or pneumothorax. Bones unremarkable. IMPRESSION: Minimal RIGHT basilar atelectasis. Electronically Signed   By: Ulyses SouthwardMark  Boles M.D.   On: 01-24-2017 16:40        Scheduled Meds: . docusate sodium  100 mg Oral BID  . enoxaparin (LOVENOX) injection  40 mg Subcutaneous Q24H  . lisinopril  20 mg Oral Daily   Continuous Infusions: . cefTRIAXone (ROCEPHIN)  IV    . dextrose 5 % and 0.2 % NaCl 150 mL/hr at 07/12/17 0608     LOS: 0 days    Time spent: 25 minutes    Katrinka BlazingAlex U Kadolph, MD [facility] Pager [phone]  If 7PM-7AM, please contact night-coverage [email or web address] Password [email or web address] 07/12/2017, 1:26 PM

## 2017-07-12 NOTE — Care Management Obs Status (Signed)
MEDICARE OBSERVATION STATUS NOTIFICATION   Patient Details  Name: Halina MaidensLonnie Mignone MRN: [id number] Date of Birth: 1956/09/06   Medicare Observation Status Notification Given:  Yes    Malcolm MetroChildress, Yousof Alderman Demske, RN 07/12/2017, 8:53 AM

## 2017-07-12 NOTE — Progress Notes (Signed)
Dr. Rinaldo RatelKadolph paged and made aware of pt's elevated BP. Awaiting return page at this time.

## 2017-07-13 ENCOUNTER — Observation Stay (HOSPITAL_COMMUNITY): Payer: Medicare Other

## 2017-07-13 DIAGNOSIS — R5381 Other malaise: Secondary | ICD-10-CM | POA: Diagnosis not present

## 2017-07-13 DIAGNOSIS — E860 Dehydration: Secondary | ICD-10-CM | POA: Diagnosis not present

## 2017-07-13 DIAGNOSIS — N390 Urinary tract infection, site not specified: Secondary | ICD-10-CM | POA: Diagnosis not present

## 2017-07-13 DIAGNOSIS — R410 Disorientation, unspecified: Secondary | ICD-10-CM

## 2017-07-13 DIAGNOSIS — F79 Unspecified intellectual disabilities: Secondary | ICD-10-CM | POA: Diagnosis not present

## 2017-07-13 DIAGNOSIS — M24562 Contracture, left knee: Secondary | ICD-10-CM | POA: Diagnosis not present

## 2017-07-13 DIAGNOSIS — M24561 Contracture, right knee: Secondary | ICD-10-CM | POA: Diagnosis not present

## 2017-07-13 DIAGNOSIS — R400 Somnolence: Secondary | ICD-10-CM | POA: Diagnosis not present

## 2017-07-13 DIAGNOSIS — E871 Hypo-osmolality and hyponatremia: Secondary | ICD-10-CM

## 2017-07-13 LAB — BASIC METABOLIC PANEL
Anion gap: 5 (ref 5–15)
BUN: 10 mg/dL (ref 6–20)
CHLORIDE: 92 mmol/L — AB (ref 101–111)
CO2: 37 mmol/L — AB (ref 22–32)
Calcium: 8.4 mg/dL — ABNORMAL LOW (ref 8.9–10.3)
Creatinine, Ser: 0.42 mg/dL — ABNORMAL LOW (ref 0.61–1.24)
GFR calc Af Amer: >60 mL/min (ref >60)
GFR calc non Af Amer: >60 mL/min (ref >60)
GLUCOSE: 182 mg/dL — AB (ref 65–99)
POTASSIUM: 4.2 mmol/L (ref 3.5–5.1)
Sodium: 134 mmol/L — ABNORMAL LOW (ref 135–145)

## 2017-07-13 LAB — CBC
HEMATOCRIT: 32.9 % — AB (ref 39.0–52.0)
Hemoglobin: 10.7 g/dL — ABNORMAL LOW (ref 13.0–17.0)
MCH: 28.7 pg (ref 26.0–34.0)
MCHC: 32.5 g/dL (ref 30.0–36.0)
MCV: 88.2 fL (ref 78.0–100.0)
Platelets: 162 10*3/uL (ref 150–400)
RBC: 3.73 MIL/uL — AB (ref 4.22–5.81)
RDW: 14.3 % (ref 11.5–15.5)
WBC: 8 10*3/uL (ref 4.0–10.5)

## 2017-07-13 LAB — GLUCOSE, CAPILLARY: GLUCOSE-CAPILLARY: 121 mg/dL — AB (ref 65–99)

## 2017-07-13 LAB — URINE CULTURE: Culture: <10000 — AB

## 2017-07-13 LAB — HIV ANTIBODY (ROUTINE TESTING W REFLEX): HIV SCREEN 4TH GENERATION: NONREACTIVE

## 2017-07-13 MED ORDER — FENTANYL CITRATE (PF) 100 MCG/2ML IJ SOLN
INTRAMUSCULAR | Status: AC
  Administered 2017-07-13: 50 ug
  Filled 2017-07-13: qty 2

## 2017-07-13 MED ORDER — VANCOMYCIN HCL IN DEXTROSE 1-5 GM/200ML-% IV SOLN
1000.0000 mg | Freq: Once | INTRAVENOUS | Status: AC
Start: 2017-07-13 — End: 2017-07-14
  Administered 2017-07-13: 1000 mg via INTRAVENOUS
  Filled 2017-07-13: qty 200

## 2017-07-13 MED ORDER — FENTANYL CITRATE (PF) 2500 MCG/50ML IJ SOLN
INTRAMUSCULAR | Status: AC
  Filled 2017-07-13: qty 50

## 2017-07-13 MED ORDER — FENTANYL BOLUS VIA INFUSION
50.0000 ug | INTRAVENOUS | Status: DC | PRN
Start: 2017-07-13 — End: 2017-07-20
  Administered 2017-07-14: 50 ug via INTRAVENOUS
  Filled 2017-07-13: qty 50

## 2017-07-13 MED ORDER — DEXTROSE 5 % IV SOLN
0.0000 ug/min | INTRAVENOUS | Status: DC
  Administered 2017-07-14: 4 ug/min via INTRAVENOUS
  Administered 2017-07-14: 8 ug/min via INTRAVENOUS
  Administered 2017-07-14: 12 ug/min via INTRAVENOUS
  Administered 2017-07-15: 2 ug/min via INTRAVENOUS
  Filled 2017-07-13 (×4): qty 4

## 2017-07-13 MED ORDER — VANCOMYCIN HCL IN DEXTROSE 750-5 MG/150ML-% IV SOLN
750.0000 mg | Freq: Two times a day (BID) | INTRAVENOUS | Status: DC
  Filled 2017-07-13: qty 150

## 2017-07-13 MED ORDER — FENTANYL CITRATE (PF) 100 MCG/2ML IJ SOLN: 50.0000 ug | Freq: Once | INTRAMUSCULAR | Status: AC

## 2017-07-13 MED ORDER — DEXTROSE 5 % IV SOLN
1.0000 g | Freq: Three times a day (TID) | INTRAVENOUS | Status: DC
  Administered 2017-07-13 – 2017-07-14 (×2): 1 g via INTRAVENOUS
  Filled 2017-07-13 (×5): qty 1

## 2017-07-13 MED ORDER — FENTANYL CITRATE (PF) 100 MCG/2ML IJ SOLN
50.0000 ug | Freq: Once | INTRAMUSCULAR | Status: AC
  Administered 2017-07-13: 50 ug via INTRAVENOUS
  Filled 2017-07-13: qty 2

## 2017-07-13 MED ORDER — SODIUM CHLORIDE 0.9 % IV SOLN
25.0000 ug/h | INTRAVENOUS | Status: DC
  Administered 2017-07-13: 50 ug/h via INTRAVENOUS
  Administered 2017-07-15: 75 ug/h via INTRAVENOUS
  Administered 2017-07-16 – 2017-07-19 (×4): 100 ug/h via INTRAVENOUS
  Administered 2017-07-20 – 2017-07-21 (×2): 150 ug/h via INTRAVENOUS
  Filled 2017-07-13 (×7): qty 50

## 2017-07-13 NOTE — Progress Notes (Signed)
PROGRESS NOTE    Reginald [name]  ZOX:[id number] DOB: January 09, 1956 DOA: 07/13/2017 PCP: Patient, [name] Per    Brief Narrative:  Reginald [name] is a 61 y.o. male with history of intellectual disability, HTN, dysphagia, muscle contractures, anxiety, PNA and polyneuropathy presented to ED today sent from [facility] nursing facility for lethargy, hypotension and decreased mobility.  Pocketing food per staff.  Had fever at the SNF.  Got Rocepin 1 gm at the SNF this am before coming here.  Also his BP's were high at the SNF.    At baseline pt is reportedly alert and gives one-word responses typically.  In ED pt is somnolent and not responding verbally.  UA shows sig pyuria and hematuria.  CXR is negative and CT head shows nothing acute.  Got 1 L IVF's in the ED.    Patient is awake, makes eye contact, smiles a bit but no verbal responses.  Doesn't follow any commands.  No family here.    No old admissions in EPIC.  Only in Care Everywhere is a dental caries procedure from 04/2014 at [facility].  Started on Ceftriaxone.   Assessment & Plan:   Active Problems:   Intellectual disability   Altered mental status   Acute lower UTI   Dehydration   Contractures of both knees   Debility   Bedbound   Essential hypertension  Hypernatremia now hyponatremia - treated with IVF - will stop IVF and monitor - repeat BMP in am   Altered mental status , acute on chronic  - due to dehydration and UTI - Hold sedating meds, IV abx - can start PO - discussed with SW who states that facility [facility]) state patient is close to his baseline- he occasionally will yell out what he would like  UTI  - urine Cx sent and showing less than 10k colony growth - cont IV Rocephin  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  He was seen and evaluated by SLP yesterday.    Objective: Vitals:   07/12/17 2043 07/12/17 2121 07/13/17 0645 07/13/17 1457  BP:  (!) 177/87 (!) 167/64 115/63  Pulse:  80 77 98  Resp:  18 18 16   Temp:  98.6 F (37 C) 99.3 F (37.4 C) 99.1 F (37.3 C)  TempSrc:  Oral Axillary Axillary  SpO2: 93% 93% 93% 93%  Weight:      Height:        Intake/Output Summary (Last 24 hours) at 07/13/17 1621 Last data filed at 07/13/17 1300  Gross per 24 hour  Intake           1942.5 ml  Output             1000 ml  Net            942.5 ml   Filed Weights   07/13/2017 1610 07/18/2017 2252 07/12/17 0641  Weight: 54.4  kg (120 lb) 54.7 kg (120 lb 11.2 oz) 54.4 kg (120 lb)    Examination:  General exam: Appears calm and comfortable  Respiratory system: Clear to auscultation. Respiratory effort normal. Cardiovascular system: S1 & S2 heard, RRR. II/VI systolic murmur appreciated. No JVD, rubs, gallops or clicks. No pedal edema. Gastrointestinal system: Abdomen is nondistended, soft and nontender. No organomegaly or masses felt. Normal bowel sounds heard. Central nervous system: Not able to assess Extremities: Contracted upper and lower extremities. Skin: No rashes, lesions or ulcers Psychiatry: Not able to assess     Data Reviewed: I have personally reviewed following labs and imaging studies  CBC:  Recent Labs Lab 2016-10-24 1620 07/12/17 0421  WBC 6.6 8.4  NEUTROABS 5.5  --   HGB 11.6* 11.3*  HCT 36.6* 35.6*  MCV 89.3 89.4  PLT 234 259   Basic Metabolic Panel:  Recent Labs Lab 2016-10-24 1620 07/12/17 0421  07/13/17 1233  NA 149* 143 134*  K 4.3 4.1 4.2  CL 104 100* 92*  CO2 36* 36* 37*  GLUCOSE 145* 174* 182*  BUN 15 10 10   CREATININE 0.40* 0.32* 0.42*  CALCIUM 9.4 9.1 8.4*   GFR: Estimated Creatinine Clearance: 75.6 mL/min (A) (by C-G formula based on SCr of 0.42 mg/dL (L)). Liver Function Tests:  Recent Labs Lab 2016-10-24 1620  AST 36  ALT 58  ALKPHOS 167*  BILITOT 0.2*  PROT 6.8  ALBUMIN 3.3*   No results for input(s): LIPASE, AMYLASE in the last 168 hours. No results for input(s): AMMONIA in the last 168 hours. Coagulation Profile: No results for input(s): INR, PROTIME in the last 168 hours. Cardiac Enzymes: No results for input(s): CKTOTAL, CKMB, CKMBINDEX, TROPONINI in the last 168 hours. BNP (last 3 results) No results for input(s): PROBNP in the last 8760 hours. HbA1C: No results for input(s): HGBA1C in the last 72 hours. CBG: No results for input(s): GLUCAP in the last 168 hours. Lipid Profile: No results for input(s): CHOL, HDL, LDLCALC, TRIG, CHOLHDL, LDLDIRECT in the last 72 hours. Thyroid Function Tests: No results for input(s): TSH, T4TOTAL, FREET4, T3FREE, THYROIDAB in the last 72 hours. Anemia Panel: No results for input(s): VITAMINB12, FOLATE, FERRITIN, TIBC, IRON, RETICCTPCT in the last 72 hours. Sepsis Labs:  Recent Labs Lab 2016-10-24 1625  LATICACIDVEN 1.1    Recent Results (from the past 240 hour(s))  Urine culture     Status: Abnormal   Collection Time: 2016-10-24  4:36 PM  Result Value Ref Range Status   Specimen Description URINE, CLEAN CATCH  Final   Special Requests NONE  Final   Culture (A)  Final    <10,000 COLONIES/mL INSIGNIFICANT GROWTH Performed at [facility], [address] [place], [place] [address]    Report Status 07/13/2017 FINAL  Final  MRSA PCR Screening     Status: None   Collection Time: 2016-10-24 10:05 PM  Result Value Ref Range Status   MRSA by PCR NEGATIVE NEGATIVE Final    Comment:        The GeneXpert  MRSA Assay (FDA approved for NASAL specimens only), is one component of a comprehensive MRSA colonization surveillance program. It is not intended to diagnose MRSA infection nor to guide or monitor treatment for MRSA infections.          Radiology Studies: Ct Head Wo Contrast  Result Date: 03-19-17 CLINICAL DATA:  Lethargy, hypertension, low-grade fever, decreased mobility EXAM: CT HEAD WITHOUT CONTRAST TECHNIQUE: Contiguous axial images were obtained from  the base of the skull through the vertex without intravenous contrast. Sagittal and coronal MPR images reconstructed from axial data set. COMPARISON:  11/16/2016 FINDINGS: Brain: Normal ventricular morphology. No midline shift or mass effect. Minimal small vessel chronic ischemic changes of deep cerebral white matter. Otherwise normal appearance of brain parenchyma. No intracranial hemorrhage, mass lesion or evidence of acute infarction. No extra-axial fluid collections. Vascular: Unremarkable Skull: Intact Sinuses/Orbits: Cleared Other: N/A IMPRESSION: Minimal small vessel chronic ischemic changes of deep cerebral white matter. No acute intracranial abnormalities. No significant interval change. Electronically Signed   By: Ulyses Southward M.D.   On: 06/28/2017 17:23   Dg Chest Portable 1 View  Result Date: 07/23/2017 CLINICAL DATA:  Low-grade fever, lethargy, hypertension, decreased mobility EXAM: PORTABLE CHEST 1 VIEW COMPARISON:  Portable exam 1623 hours without priors for comparison FINDINGS: Normal heart size, mediastinal contours, and pulmonary vascularity. Azygos fissure noted. Minimal RIGHT basilar atelectasis. Lungs otherwise clear. No infiltrate, pleural effusion or pneumothorax. Bones unremarkable. IMPRESSION: Minimal RIGHT basilar atelectasis. Electronically Signed   By: Ulyses Southward M.D.   On: 07/01/2017 16:40        Scheduled Meds: . docusate sodium  100 mg Oral BID  . enoxaparin (LOVENOX) injection  40 mg Subcutaneous  Q24H  . lisinopril  20 mg Oral Daily   Continuous Infusions: . cefTRIAXone (ROCEPHIN)  IV Stopped (07/12/17 1758)     LOS: 0 days    Time spent: 25 minutes    Katrinka Blazing, MD [facility] Pager [phone]  If 7PM-7AM, please contact night-coverage [email or web address] Password [email or web address] 07/13/2017, 4:21 PM

## 2017-07-13 NOTE — ED Provider Notes (Signed)
Reginald [name]  Department of Emergency Medicine   Code Blue CONSULT NOTE  Chief Complaint: Cardiac arrest/unresponsive   Level V Caveat: Unresponsive  History of present illness: I was contacted by the hospital for a CODE BLUE cardiac arrest upstairs and presented to the patient's bedside. Patient was apparently admitted for altered mental status and earlier the day was doing okay but sleepy. Was found to have low blood pressures and unresponsive so a rapid response was called patient was found to be apneic so a code blue called.  ROS: Unable to obtain, Level V caveat  Scheduled Meds: . docusate sodium  100 mg Oral BID  . enoxaparin (LOVENOX) injection  40 mg Subcutaneous Q24H  . fentaNYL      . fentaNYL (SUBLIMAZE) injection  50 mcg Intravenous Once  . lisinopril  20 mg Oral Daily   Continuous Infusions: . ceFEPime (MAXIPIME) IV    . vancomycin     PRN Meds:.acetaminophen **OR** acetaminophen, polyethylene glycol, polyvinyl alcohol, sodium phosphate Past Medical History:  Diagnosis Date  . Abnormal posture   . Anxiety   . Cognitive communication deficit   . Contracture of muscle   . Contracture of muscle   . Dysphagia   . Hypertension   . Insomnia   . Moderate intellectual disabilities   . Pneumonia   . Polyneuropathy    History reviewed. No pertinent surgical history. Social History   Social History  . Marital status: Single    Spouse name: N/A  . Number of children: N/A  . Years of education: N/A   Occupational History  . Not on file.   Social History Main Topics  . Smoking status: Never Smoker  . Smokeless tobacco: Never Used  . Alcohol use No  . Drug use: No  . Sexual activity: No   Other Topics Concern  . Not on file   Social History Narrative  . No narrative on file   No Known Allergies  Last set of Vital Signs (not current) Vitals:   07/13/17 2104 07/13/17 2119  BP:  (!) 60/37  Pulse:  96  Resp:  (!) 8  Temp:  100 F (37.8  C)  SpO2: 92% (!) 72%      Physical Exam  Gen: unresponsive Cardiovascular: hypotensive, tachycardic Resp: apneic. Breath sounds equal bilaterally with bagging  Abd: nondistended  Neuro: GCS 3, unresponsive to pain  HEENT: No blood in posterior pharynx, gag reflex absent  Neck: No crepitus  Musculoskeletal: No deformity  Skin: warm  Procedures  INTUBATION Performed by: Marily Memos See separate procedure note for this.  CENTRAL LINE INSERTION Performed by: Marily Memos See separate procedure note for this.  CRITICAL CARE Performed by: Marily Memos Total critical care time: 45 minutes Critical care time was exclusive of separately billable procedures and treating other patients. Critical care was necessary to treat or prevent imminent or life-threatening deterioration. Critical care was time spent personally by me on the following activities: development of treatment plan with patient and/or surrogate as well as nursing, discussions with consultants, evaluation of patient's response to treatment, examination of patient, obtaining history from patient or surrogate, ordering and performing treatments and interventions, ordering and review of laboratory studies, ordering and review of radiographic studies, pulse oximetry and re-evaluation of patient's condition.   Medical Decision making  Apparently here for her urinary tract infection altered mental status. Patient apneic and with decreased pulses on arrival. Respiratory attempting to intubate however not able to secondary to patient  clamping down. Patient was given Dr. Gwendolyn GrantMaroney M and subsequently a grade 2 view was obtained and patient was intubated by myself. His heart rate was 160 this time suspect it is likely related to pain and was given fentanyl and etomidate.   Shortly after he had a decrease in pressures to a low of 40/33 a bedside ultrasound it did appear that his heart was beating so started on Levophed. This is a line  was placed by myself as per the procedure note. Quickly titrated Levophed up and patient became hypertensive so titrated down to a more reasonable dose. Line was secured. Discussed with the primary team and nursing that this line removed as soon as possible and replaced with a more sterile line. There are my departure patient had a blood pressure 120s over 70, heart rate in the 90s, oxygenation 100%. There are attempting to do an NG tube and a follow-up chest x-ray will be done as well.    Marily MemosMesner, Bryanne Riquelme, MD 07/13/17 2225

## 2017-07-13 NOTE — NC FL2 (Signed)
[place] MEDICAID FL2 LEVEL OF CARE SCREENING TOOL     IDENTIFICATION  Patient Name: Reginald [name] Birthdate: 10-Oct-1955 Sex: male Admission Date (Current Location): 04-Nov-2016  [facility] [place]   Facility and Address:  [facility],  [address], [place] [id number]      Provider Number: [id number]  Attending Physician Name and Address:  Filbert SchilderKadolph, Alexandria U, MD  Relative Name and Phone Number:       Current Level of Care: SNF Recommended Level of Care: Nursing Facility Prior Approval Number:    Date Approved/Denied:   PASRR Number:    Discharge Plan: SNF    Current Diagnoses: Patient Active Problem List   Diagnosis Date Noted  . Intellectual disability 04-Nov-2016  . Altered mental status 04-Nov-2016  . Acute lower UTI 04-Nov-2016  . Dehydration 04-Nov-2016  . Contractures of both knees 04-Nov-2016  . Debility 04-Nov-2016  . Bedbound 04-Nov-2016  . Essential hypertension 04-Nov-2016    Orientation RESPIRATION BLADDER Height & Weight        Normal Continent Weight: 120 lb (54.4 kg) Height:  5\' 5"  (165.1 cm)  BEHAVIORAL SYMPTOMS/MOOD NEUROLOGICAL BOWEL NUTRITION STATUS      Continent Diet (see discharge summary)  AMBULATORY STATUS COMMUNICATION OF NEEDS Skin   Total Care Verbally Normal                       Personal Care Assistance Level of Assistance  Total care       Total Care Assistance: Maximum assistance   Functional Limitations Info  Sight, Hearing, Speech Sight Info: Adequate Hearing Info: Adequate Speech Info: Impaired    SPECIAL CARE FACTORS FREQUENCY                       Contractures Contractures Info: Not present    Additional Factors Info  Psychotropic, Code Status Code Status Info: Full Code   Psychotropic Info: Xanax         Current Medications (07/13/2017):  This is the current hospital active medication list Current Facility-Administered Medications  Medication Dose Route  Frequency Provider Last Rate Last Dose  . acetaminophen (TYLENOL) tablet 650 mg  650 mg Oral Q6H PRN Delano MetzSchertz, Robert, MD       Or  . acetaminophen (TYLENOL) suppository 650 mg  650 mg Rectal Q6H PRN Delano MetzSchertz, Robert, MD      . cefTRIAXone (ROCEPHIN) 1 g in dextrose 5 % 50 mL IVPB  1 g Intravenous Q24H Delano MetzSchertz, Robert, MD   Stopped at 07/12/17 1758  . dextrose 5 % and 0.2 % NaCl infusion   Intravenous Continuous Delano MetzSchertz, Robert, MD 150 mL/hr at 07/13/17 0950    . docusate sodium (COLACE) capsule 100 mg  100 mg Oral BID Delano MetzSchertz, Robert, MD      . enoxaparin (LOVENOX) injection 40 mg  40 mg Subcutaneous Q24H Delano MetzSchertz, Robert, MD   40 mg at 07/12/17 2341  . lisinopril (PRINIVIL,ZESTRIL) tablet 20 mg  20 mg Oral Daily Delano MetzSchertz, Robert, MD   20 mg at 07/12/17 1150  . polyethylene glycol (MIRALAX / GLYCOLAX) packet 17 g  17 g Oral Daily PRN Delano MetzSchertz, Robert, MD      . polyvinyl alcohol (LIQUIFILM TEARS) 1.4 % ophthalmic solution 1 drop  1 drop Both Eyes PRN Filbert SchilderKadolph, Alexandria U, MD   1 drop at 07/12/17 1150  . sodium phosphate (FLEET) 7-19 GM/118ML enema 1 enema  1 enema Rectal Once PRN Schertz,  Molly Maduro, MD         Discharge Medications: Please see discharge summary for a list of discharge medications.  Relevant Imaging Results:  Relevant Lab Results:   Additional Information    Lanetta Figuero, Juleen China, LCSW

## 2017-07-13 NOTE — Progress Notes (Signed)
eLink Physician-Brief Progress Note Patient Name: Reginald [name] DOB: 05/09/1956 MRN: [id number]   Date of Service  07/13/2017  HPI/Events of Note  Near arrest on floor apnic Admitted for Urosepsis with neg head CT per notes  eICU Interventions  pcxr ett wnl Follow pcxr for worsening infiltrarte mucous plug? Get abg Some agitation Add fent drip     Intervention Category Major Interventions: Airway management Evaluation Type: New Patient Evaluation  FEINSTEIN,DANIEL J. 07/13/2017, 10:41 PM

## 2017-07-13 NOTE — ED Provider Notes (Addendum)
   Physical Exam  ED Course  Procedure Name: Intubation Date/Time: 07/13/2017 10:21 PM Performed by: Merrily Pew Pre-anesthesia Checklist: Patient identified Preoxygenation: Pre-oxygenation with 100% oxygen Induction Type: Rapid sequence Ventilation: Mask ventilation without difficulty Laryngoscope Size: Mac and 3 Grade View: Grade II Tube size: 7.5 mm Number of attempts: 1 Airway Equipment and Method: Rigid stylet Placement Confirmation: ETT inserted through vocal cords under direct vision Secured at: 24 cm Tube secured with: Tape Dental Injury: Teeth and Oropharynx as per pre-operative assessment  Future Recommendations: Recommend- induction with short-acting agent, and alternative techniques readily available     .Central Line Date/Time: 07/13/2017 10:26 PM Performed by: Merrily Pew Authorized by: Merrily Pew   Consent:    Consent obtained:  Emergent situation Pre-procedure details:    Hand hygiene: Hand hygiene performed prior to insertion     All elements of maximal sterile barrier technique followed: no gown for myself 2/2 emergent situation.     Skin preparation:  Alcohol Anesthesia (see MAR for exact dosages):    Anesthesia method:  None Procedure details:    Location:  L internal jugular   Site selection rationale:  Ease of access   Patient position:  Flat   Procedural supplies:  Triple lumen   Landmarks identified: no     Ultrasound guidance: yes     Sterile ultrasound techniques: Sterile gel and sterile probe covers were used     Number of attempts:  1   Successful placement: yes   Post-procedure details:    Post-procedure:  Dressing applied and line sutured   Assessment:  Blood return through all ports, free fluid flow, placement verified by x-ray and no pneumothorax on x-ray   Patient tolerance of procedure:  Tolerated well, no immediate complications      Merrily Pew, MD 07/13/17 2227

## 2017-07-13 NOTE — Progress Notes (Signed)
Pharmacy Antibiotic Note  Halina MaidensLonnie Batdorf is a 61 y.o. male admitted on Aug 06, 2017 with sepsis.  Pharmacy has been consulted for Vancomycin and Cefepime dosing.  Plan:  Vancomycin 1000mg  IV x 1 then 750mg  IV q12h Check trough at steady state Cefepime 1gm IV q8h Monitor labs, renal fxn, progress and c/s Deescalate ABX when improved / appropriate.    Height: 5\' 5"  (165.1 cm) Weight: 120 lb (54.4 kg) IBW/kg (Calculated) : 61.5  Temp (24hrs), Avg:99.5 F (37.5 C), Min:99.1 F (37.3 C), Max:100 F (37.8 C)   Recent Labs Lab 2017-05-03 1620 2017-05-03 1625 07/12/17 0421 07/13/17 1233  WBC 6.6  --  8.4  --   CREATININE 0.40*  --  0.32* 0.42*  LATICACIDVEN  --  1.1  --   --     Estimated Creatinine Clearance: 75.6 mL/min (A) (by C-G formula based on SCr of 0.42 mg/dL (L)).    No Known Allergies  Antimicrobials this admission: Cefepime 10/18 >>  Vancomycin 10/18 >>   Dose adjustments this admission:  Microbiology results:  BCx: pending  UCx: pending   Sputum:    MRSA PCR:   Thank you for allowing pharmacy to be a part of this patient's care.  Valrie HartHall, Masayoshi Couzens A 07/13/2017 10:22 PM

## 2017-07-13 NOTE — Progress Notes (Signed)
eLink Physician-Brief Progress Note Patient Name: Halina MaidensLonnie Coiro DOB: 24-May-1956 MRN: [id number]   Date of Service  07/13/2017  HPI/Events of Note  ARF Tubed Near arrest  eICU Interventions  In need vent orders Will place  get abg Agitation fent drip     Intervention Category Major Interventions: Airway management  Nelda BucksFEINSTEIN,DANIEL J. 07/13/2017, 10:39 PM

## 2017-07-14 ENCOUNTER — Observation Stay (HOSPITAL_COMMUNITY): Payer: Medicare Other

## 2017-07-14 ENCOUNTER — Encounter (HOSPITAL_COMMUNITY): Payer: Self-pay | Admitting: Cardiology

## 2017-07-14 ENCOUNTER — Inpatient Hospital Stay (HOSPITAL_COMMUNITY): Payer: Medicare Other

## 2017-07-14 DIAGNOSIS — E870 Hyperosmolality and hypernatremia: Secondary | ICD-10-CM | POA: Diagnosis present

## 2017-07-14 DIAGNOSIS — R319 Hematuria, unspecified: Secondary | ICD-10-CM | POA: Diagnosis present

## 2017-07-14 DIAGNOSIS — R410 Disorientation, unspecified: Secondary | ICD-10-CM | POA: Diagnosis not present

## 2017-07-14 DIAGNOSIS — Z7189 Other specified counseling: Secondary | ICD-10-CM | POA: Diagnosis not present

## 2017-07-14 DIAGNOSIS — G809 Cerebral palsy, unspecified: Secondary | ICD-10-CM | POA: Diagnosis present

## 2017-07-14 DIAGNOSIS — F79 Unspecified intellectual disabilities: Secondary | ICD-10-CM | POA: Diagnosis not present

## 2017-07-14 DIAGNOSIS — R0681 Apnea, not elsewhere classified: Secondary | ICD-10-CM | POA: Diagnosis not present

## 2017-07-14 DIAGNOSIS — J9602 Acute respiratory failure with hypercapnia: Secondary | ICD-10-CM | POA: Diagnosis not present

## 2017-07-14 DIAGNOSIS — E860 Dehydration: Secondary | ICD-10-CM | POA: Diagnosis present

## 2017-07-14 DIAGNOSIS — I639 Cerebral infarction, unspecified: Secondary | ICD-10-CM | POA: Diagnosis present

## 2017-07-14 DIAGNOSIS — M24562 Contracture, left knee: Secondary | ICD-10-CM | POA: Diagnosis present

## 2017-07-14 DIAGNOSIS — R400 Somnolence: Secondary | ICD-10-CM | POA: Diagnosis not present

## 2017-07-14 DIAGNOSIS — G9349 Other encephalopathy: Secondary | ICD-10-CM | POA: Diagnosis present

## 2017-07-14 DIAGNOSIS — Z515 Encounter for palliative care: Secondary | ICD-10-CM | POA: Diagnosis not present

## 2017-07-14 DIAGNOSIS — I214 Non-ST elevation (NSTEMI) myocardial infarction: Secondary | ICD-10-CM

## 2017-07-14 DIAGNOSIS — M24561 Contracture, right knee: Secondary | ICD-10-CM | POA: Diagnosis present

## 2017-07-14 DIAGNOSIS — E871 Hypo-osmolality and hyponatremia: Secondary | ICD-10-CM | POA: Diagnosis not present

## 2017-07-14 DIAGNOSIS — A419 Sepsis, unspecified organism: Secondary | ICD-10-CM | POA: Diagnosis present

## 2017-07-14 DIAGNOSIS — R5381 Other malaise: Secondary | ICD-10-CM | POA: Diagnosis present

## 2017-07-14 DIAGNOSIS — R740 Nonspecific elevation of levels of transaminase and lactic acid dehydrogenase [LDH]: Secondary | ICD-10-CM | POA: Diagnosis not present

## 2017-07-14 DIAGNOSIS — R569 Unspecified convulsions: Secondary | ICD-10-CM | POA: Diagnosis present

## 2017-07-14 DIAGNOSIS — N390 Urinary tract infection, site not specified: Secondary | ICD-10-CM | POA: Diagnosis present

## 2017-07-14 DIAGNOSIS — R402434 Glasgow coma scale score 3-8, 24 hours or more after hospital admission: Secondary | ICD-10-CM | POA: Diagnosis not present

## 2017-07-14 DIAGNOSIS — J9601 Acute respiratory failure with hypoxia: Secondary | ICD-10-CM | POA: Diagnosis not present

## 2017-07-14 DIAGNOSIS — J9690 Respiratory failure, unspecified, unspecified whether with hypoxia or hypercapnia: Secondary | ICD-10-CM

## 2017-07-14 DIAGNOSIS — F71 Moderate intellectual disabilities: Secondary | ICD-10-CM | POA: Diagnosis present

## 2017-07-14 DIAGNOSIS — I10 Essential (primary) hypertension: Secondary | ICD-10-CM | POA: Diagnosis present

## 2017-07-14 DIAGNOSIS — J690 Pneumonitis due to inhalation of food and vomit: Secondary | ICD-10-CM | POA: Diagnosis not present

## 2017-07-14 DIAGNOSIS — N179 Acute kidney failure, unspecified: Secondary | ICD-10-CM | POA: Diagnosis present

## 2017-07-14 DIAGNOSIS — G259 Extrapyramidal and movement disorder, unspecified: Secondary | ICD-10-CM | POA: Diagnosis present

## 2017-07-14 DIAGNOSIS — Z79899 Other long term (current) drug therapy: Secondary | ICD-10-CM | POA: Diagnosis not present

## 2017-07-14 DIAGNOSIS — E785 Hyperlipidemia, unspecified: Secondary | ICD-10-CM | POA: Diagnosis present

## 2017-07-14 DIAGNOSIS — I959 Hypotension, unspecified: Secondary | ICD-10-CM | POA: Diagnosis present

## 2017-07-14 DIAGNOSIS — Z7401 Bed confinement status: Secondary | ICD-10-CM | POA: Diagnosis not present

## 2017-07-14 LAB — BASIC METABOLIC PANEL
ANION GAP: 11 (ref 5–15)
Anion gap: 10 (ref 5–15)
BUN: 30 mg/dL — ABNORMAL HIGH (ref 6–20)
BUN: 39 mg/dL — ABNORMAL HIGH (ref 6–20)
CHLORIDE: 88 mmol/L — AB (ref 101–111)
CHLORIDE: 89 mmol/L — AB (ref 101–111)
CO2: 31 mmol/L (ref 22–32)
CO2: 28 mmol/L (ref 22–32)
CREATININE: 2.37 mg/dL — AB (ref 0.61–1.24)
Calcium: 8.2 mg/dL — ABNORMAL LOW (ref 8.9–10.3)
Calcium: 7.8 mg/dL — ABNORMAL LOW (ref 8.9–10.3)
Creatinine, Ser: 1.92 mg/dL — ABNORMAL HIGH (ref 0.61–1.24)
GFR calc non Af Amer: 36 mL/min — ABNORMAL LOW (ref >60)
GFR calc non Af Amer: 28 mL/min — ABNORMAL LOW (ref >60)
GFR, EST AFRICAN AMERICAN: 42 mL/min — AB (ref >60)
GFR, EST AFRICAN AMERICAN: 33 mL/min — AB (ref >60)
Glucose, Bld: 124 mg/dL — ABNORMAL HIGH (ref 65–99)
Glucose, Bld: 126 mg/dL — ABNORMAL HIGH (ref 65–99)
POTASSIUM: 4.2 mmol/L (ref 3.5–5.1)
POTASSIUM: 3.6 mmol/L (ref 3.5–5.1)
SODIUM: 130 mmol/L — AB (ref 135–145)
SODIUM: 127 mmol/L — AB (ref 135–145)

## 2017-07-14 LAB — BLOOD GAS, ARTERIAL
ACID-BASE EXCESS: 8.6 mmol/L — AB (ref 0.0–2.0)
ACID-BASE EXCESS: 7.9 mmol/L — AB (ref 0.0–2.0)
ACID-BASE EXCESS: 6.9 mmol/L — AB (ref 0.0–2.0)
Acid-Base Excess: 8.3 mmol/L — ABNORMAL HIGH (ref 0.0–2.0)
BICARBONATE: 33 mmol/L — AB (ref 20.0–28.0)
BICARBONATE: 31.1 mmol/L — AB (ref 20.0–28.0)
Bicarbonate: 33.1 mmol/L — ABNORMAL HIGH (ref 20.0–28.0)
Bicarbonate: 31.9 mmol/L — ABNORMAL HIGH (ref 20.0–28.0)
DRAWN BY: 221791
DRAWN BY: 28459
DRAWN BY: 28459
Drawn by: 221791
FIO2: 100
FIO2: 40
FIO2: 40
FIO2: 40
LHR: 16 {breaths}/min
MECHANICAL RATE: 16
MECHVT: 500 mL
MECHVT: 500 mL
O2 SAT: 99.1 %
O2 SAT: 98.2 %
O2 Saturation: 99 %
O2 Saturation: 99.8 %
PATIENT TEMPERATURE: 37
PATIENT TEMPERATURE: 37
PCO2 ART: 30.5 mmHg — AB (ref 32.0–48.0)
PCO2 ART: 36.1 mmHg (ref 32.0–48.0)
PCO2 ART: 33.8 mmHg (ref 32.0–48.0)
PEEP/CPAP: 3 cmH2O
PEEP: 5 cmH2O
PEEP: 5 cmH2O
PEEP: 5 cmH2O
PH ART: 7.612 — AB (ref 7.350–7.450)
PH ART: 7.674 — AB (ref 7.350–7.450)
PH ART: 7.542 — AB (ref 7.350–7.450)
PO2 ART: 145 mmHg — AB (ref 83.0–108.0)
PO2 ART: 462 mmHg — AB (ref 83.0–108.0)
Patient temperature: 37
Patient temperature: 37
RATE: 12 resp/min
RATE: 16 resp/min
RATE: 22 resp/min
VT: 500 mL
VT: 500 mL
pCO2 arterial: 24.4 mmHg — ABNORMAL LOW (ref 32.0–48.0)
pH, Arterial: 7.553 — ABNORMAL HIGH (ref 7.350–7.450)
pO2, Arterial: 160 mmHg — ABNORMAL HIGH (ref 83.0–108.0)
pO2, Arterial: 135 mmHg — ABNORMAL HIGH (ref 83.0–108.0)

## 2017-07-14 LAB — COMPREHENSIVE METABOLIC PANEL
ALBUMIN: 2.8 g/dL — AB (ref 3.5–5.0)
ALK PHOS: 163 U/L — AB (ref 38–126)
ALK PHOS: 152 U/L — AB (ref 38–126)
ALT: 783 U/L — AB (ref 17–63)
ALT: 2552 U/L — AB (ref 17–63)
ANION GAP: 12 (ref 5–15)
AST: 456 U/L — AB (ref 15–41)
AST: 2036 U/L — ABNORMAL HIGH (ref 15–41)
Albumin: 2.6 g/dL — ABNORMAL LOW (ref 3.5–5.0)
Anion gap: 10 (ref 5–15)
BILIRUBIN TOTAL: 0.6 mg/dL (ref 0.3–1.2)
BUN: 19 mg/dL (ref 6–20)
BUN: 35 mg/dL — ABNORMAL HIGH (ref 6–20)
CALCIUM: 8.3 mg/dL — AB (ref 8.9–10.3)
CALCIUM: 8 mg/dL — AB (ref 8.9–10.3)
CO2: 31 mmol/L (ref 22–32)
CO2: 30 mmol/L (ref 22–32)
CREATININE: 2.26 mg/dL — AB (ref 0.61–1.24)
Chloride: 90 mmol/L — ABNORMAL LOW (ref 101–111)
Chloride: 89 mmol/L — ABNORMAL LOW (ref 101–111)
Creatinine, Ser: 1.48 mg/dL — ABNORMAL HIGH (ref 0.61–1.24)
GFR calc Af Amer: 58 mL/min — ABNORMAL LOW (ref >60)
GFR calc non Af Amer: 50 mL/min — ABNORMAL LOW (ref >60)
GFR calc non Af Amer: 30 mL/min — ABNORMAL LOW (ref >60)
GFR, EST AFRICAN AMERICAN: 35 mL/min — AB (ref >60)
GLUCOSE: 129 mg/dL — AB (ref 65–99)
GLUCOSE: 119 mg/dL — AB (ref 65–99)
Potassium: 4.9 mmol/L (ref 3.5–5.1)
Potassium: 4 mmol/L (ref 3.5–5.1)
SODIUM: 133 mmol/L — AB (ref 135–145)
SODIUM: 129 mmol/L — AB (ref 135–145)
TOTAL PROTEIN: 6.2 g/dL — AB (ref 6.5–8.1)
Total Bilirubin: 0.6 mg/dL (ref 0.3–1.2)
Total Protein: 5.7 g/dL — ABNORMAL LOW (ref 6.5–8.1)

## 2017-07-14 LAB — MRSA PCR SCREENING: MRSA by PCR: INVALID — AB

## 2017-07-14 LAB — TROPONIN I: Troponin I: 9.58 ng/mL (ref <0.03)

## 2017-07-14 MED ORDER — VANCOMYCIN HCL IN DEXTROSE 1-5 GM/200ML-% IV SOLN
1000.0000 mg | INTRAVENOUS | Status: DC
  Administered 2017-07-14: 1000 mg via INTRAVENOUS
  Filled 2017-07-14: qty 200

## 2017-07-14 MED ORDER — SODIUM CHLORIDE 0.9 % IV SOLN
75.0000 mL/h | INTRAVENOUS | Status: DC
  Administered 2017-07-15: 75 mL/h via INTRAVENOUS

## 2017-07-14 MED ORDER — LORAZEPAM 2 MG/ML IJ SOLN
INTRAMUSCULAR | Status: AC
  Administered 2017-07-14: 2 mg
  Filled 2017-07-14: qty 1

## 2017-07-14 MED ORDER — PANTOPRAZOLE SODIUM 40 MG IV SOLR
40.0000 mg | Freq: Two times a day (BID) | INTRAVENOUS | Status: DC
  Administered 2017-07-14 – 2017-07-20 (×12): 40 mg via INTRAVENOUS
  Filled 2017-07-14 (×12): qty 40

## 2017-07-14 MED ORDER — SODIUM CHLORIDE 0.9 % IV SOLN: 75.0000 mL/h | INTRAVENOUS | Status: DC

## 2017-07-14 MED ORDER — MUPIROCIN 2 % EX OINT
1.0000 "application " | TOPICAL_OINTMENT | Freq: Two times a day (BID) | CUTANEOUS | Status: AC
  Administered 2017-07-14 – 2017-07-18 (×9): 1 via NASAL
  Filled 2017-07-14 (×2): qty 22

## 2017-07-14 MED ORDER — ENOXAPARIN SODIUM 30 MG/0.3ML ~~LOC~~ SOLN
30.0000 mg | SUBCUTANEOUS | Status: DC
  Administered 2017-07-14 – 2017-07-15 (×2): 30 mg via SUBCUTANEOUS
  Filled 2017-07-14 (×2): qty 0.3

## 2017-07-14 MED ORDER — LORAZEPAM 2 MG/ML IJ SOLN
1.0000 mg | INTRAMUSCULAR | Status: DC | PRN
  Administered 2017-07-20: 2 mg via INTRAVENOUS
  Filled 2017-07-14: qty 1

## 2017-07-14 MED ORDER — DEXTROSE 5 % IV SOLN
2.0000 g | INTRAVENOUS | Status: DC
  Administered 2017-07-15: 2 g via INTRAVENOUS
  Filled 2017-07-14 (×3): qty 2

## 2017-07-14 MED ORDER — SODIUM CHLORIDE 0.9 % IV SOLN: INTRAVENOUS | Status: DC

## 2017-07-14 MED ORDER — LEVETIRACETAM IN NACL 500 MG/100ML IV SOLN
INTRAVENOUS | Status: AC
  Filled 2017-07-14: qty 100

## 2017-07-14 MED ORDER — CHLORHEXIDINE GLUCONATE CLOTH 2 % EX PADS
6.0000 | MEDICATED_PAD | Freq: Every day | CUTANEOUS | Status: DC
  Administered 2017-07-15 – 2017-07-16 (×2): 6 via TOPICAL

## 2017-07-14 MED ORDER — SODIUM CHLORIDE 0.9 % IV SOLN
500.0000 mg | Freq: Two times a day (BID) | INTRAVENOUS | Status: DC
  Administered 2017-07-14: 500 mg via INTRAVENOUS
  Filled 2017-07-14 (×6): qty 5

## 2017-07-14 MED ORDER — NOREPINEPHRINE BITARTRATE 1 MG/ML IV SOLN
INTRAVENOUS | Status: AC
  Filled 2017-07-14: qty 4

## 2017-07-14 MED ORDER — SODIUM CHLORIDE 0.9 % IV SOLN
500.0000 mg | Freq: Once | INTRAVENOUS | Status: AC
  Administered 2017-07-14: 500 mg via INTRAVENOUS
  Filled 2017-07-14: qty 5

## 2017-07-14 MED ORDER — ORAL CARE MOUTH RINSE
15.0000 mL | Freq: Four times a day (QID) | OROMUCOSAL | Status: DC
  Administered 2017-07-14 – 2017-07-21 (×29): 15 mL via OROMUCOSAL

## 2017-07-14 MED ORDER — CHLORHEXIDINE GLUCONATE 0.12% ORAL RINSE (MEDLINE KIT)
15.0000 mL | Freq: Two times a day (BID) | OROMUCOSAL | Status: DC
  Administered 2017-07-14 – 2017-07-20 (×13): 15 mL via OROMUCOSAL

## 2017-07-14 NOTE — NC FL2 (Signed)
[place] MEDICAID FL2 LEVEL OF CARE SCREENING TOOL     IDENTIFICATION  Patient Name: Reginald [name] Birthdate: Jul 18, 1956 Sex: male Admission Date (Current Location): 07/02/2017  [facility] and [place] Number:  [employer] and Address:  [place] [address], [place]      Provider Number: [id number]  Attending Physician Name and Address:  Eber Jones, MD  Relative Name and Phone Number:       Current Level of Care: SNF Recommended Level of Care: Nursing Facility Prior Approval Number:    Date Approved/Denied:   PASRR Number:    Discharge Plan: SNF    Current Diagnoses: Patient Active Problem List   Diagnosis Date Noted  . Respiratory failure ([place])   . Palliative care encounter   . Goals of care, counseling/discussion   . Intellectual disability 07/18/2017  . Altered mental status 07/12/2017  . Acute lower UTI 07/13/2017  . Dehydration 07/12/2017  . Contractures of both knees 07/10/2017  . Debility 07/16/2017  . Bedbound 07/26/2017  . Essential hypertension 07/01/2017    Orientation RESPIRATION BLADDER Height & Weight     Self  Normal Continent Weight: 126 lb 15.8 oz (57.6 kg) Height:  _0  (165.1 cm)  BEHAVIORAL SYMPTOMS/MOOD NEUROLOGICAL BOWEL NUTRITION STATUS      Continent Diet (see discharge summary)  AMBULATORY STATUS COMMUNICATION OF NEEDS Skin   Total Care Verbally Normal                       Personal Care Assistance Level of Assistance  Total care       Total Care Assistance: Maximum assistance   Functional Limitations Info  Sight, Hearing, Speech Sight Info: Adequate Hearing Info: Adequate Speech Info: Impaired    SPECIAL CARE FACTORS FREQUENCY                       Contractures Contractures Info: Not present    Additional Factors Info  Psychotropic, Code Status Code Status Info: Full Code   Psychotropic Info: Xanax         Current Medications (07/14/2017):   This is the current hospital active medication list Current Facility-Administered Medications  Medication Dose Route Frequency Provider Last Rate Last Dose  . 0.9 %  sodium chloride infusion  75 mL/hr Intravenous Continuous Eber Jones, MD 75 mL/hr at 07/14/17 1100 75 mL/hr at 07/14/17 1100  . 0.9 %  sodium chloride infusion  75 mL/hr Intravenous Continuous Ilene Qua U, MD      . 0.9 %  sodium chloride infusion   Intravenous Continuous Eber Jones, MD 100 mL/hr at 07/14/17 1430    . acetaminophen (TYLENOL) tablet 650 mg  650 mg Oral Q6H PRN Roney Jaffe, MD       Or  . acetaminophen (TYLENOL) suppository 650 mg  650 mg Rectal Q6H PRN Roney Jaffe, MD      . Derrill Memo ON 07/15/2017] ceFEPIme (MAXIPIME) 2 g in dextrose 5 % 50 mL IVPB  2 g Intravenous Q24H Ilene Qua U, MD      . chlorhexidine gluconate (MEDLINE KIT) (PERIDEX) 0.12 % solution 15 mL  15 mL Mouth Rinse BID Oswald Hillock, MD   15 mL at 07/14/17 0800  . [START ON 07/15/2017] Chlorhexidine Gluconate Cloth 2 % PADS 6 each  6 each Topical Q0600 Eber Jones, MD      . docusate sodium (COLACE) capsule 100 mg  100 mg Oral BID Roney Jaffe, MD      . enoxaparin (LOVENOX) injection 30 mg  30 mg Subcutaneous Q24H Ilene Qua U, MD      . fentaNYL (SUBLIMAZE) 2,500 mcg in sodium chloride 0.9 % 250 mL (10 mcg/mL) infusion  25-400 mcg/hr Intravenous Continuous Raylene Miyamoto, MD 7.5 mL/hr at 07/14/17 0324 75 mcg/hr at 07/14/17 0324  . fentaNYL (SUBLIMAZE) bolus via infusion 50 mcg  50 mcg Intravenous Q1H PRN Raylene Miyamoto, MD   50 mcg at 07/14/17 0328  . levETIRAcetam (KEPPRA) 500 mg in sodium chloride 0.9 % 100 mL IVPB  500 mg Intravenous Q12H Ilene Qua U, MD      . LORazepam (ATIVAN) injection 1-2 mg  1-2 mg Intravenous Q2H PRN Eber Jones, MD      . MEDLINE mouth rinse  15 mL Mouth Rinse QID Oswald Hillock, MD   15 mL at 07/14/17 1300  . mupirocin  ointment (BACTROBAN) 2 % 1 application  1 application Nasal BID Eber Jones, MD      . norepinephrine (LEVOPHED) 4 mg in dextrose 5 % 250 mL (0.016 mg/mL) infusion  0-40 mcg/min Intravenous Titrated Raylene Miyamoto, MD 30 mL/hr at 07/14/17 0923 8 mcg/min at 07/14/17 0923  . pantoprazole (PROTONIX) injection 40 mg  40 mg Intravenous Q12H Kadolph, Olam Idler U, MD      . polyethylene glycol (MIRALAX / GLYCOLAX) packet 17 g  17 g Oral Daily PRN Roney Jaffe, MD      . polyvinyl alcohol (LIQUIFILM TEARS) 1.4 % ophthalmic solution 1 drop  1 drop Both Eyes PRN Eber Jones, MD   1 drop at 07/12/17 1150  . sodium phosphate (FLEET) 7-19 GM/118ML enema 1 enema  1 enema Rectal Once PRN Roney Jaffe, MD      . vancomycin (VANCOCIN) IVPB 1000 mg/200 mL premix  1,000 mg Intravenous Q24H Eber Jones, MD         Discharge Medications: Please see discharge summary for a list of discharge medications.  Relevant Imaging Results:  Relevant Lab Results:   Additional Information    Leylah Tarnow, Clydene Pugh, LCSW

## 2017-07-14 NOTE — Progress Notes (Signed)
Patient was found to be hypotensive, apneic and unresponsive by RN. CODE BLUE was called, patient was found to be severely hypertensive with systolic blood pressure in 40s. Patient was intubated, started on Levophed and blood pressure improved. Patient will be transferred to ICU. Obtain CBC, CMP, blood cultures 2 Patient was started on vancomycin and cefepime for possible aspiration pneumonia.  Total time spent 40 minutes

## 2017-07-14 NOTE — Progress Notes (Signed)
Bite block placed due to pt biting on ET tube. Tiffani, RN in room and aware. Sedation is adequate. Bite block secured with tape

## 2017-07-14 NOTE — Progress Notes (Signed)
Notified patient experiencing seizure like activity- 2mg  Ativan given.  Seizure order set utilized and patient will have PRN ativan as well as be loaded with keppra.  Neurology consulted.  Troponin result of 9.5- discussed with cardiology who will see patient in consultation but likely patient is not a candidate for any invasive interventions given multi organ system failure.   Will place foley catheter and add IVF of NS.  Nephrology consulted for ATN. Need to ensure adequate urine output.  Notified blood in OG tube- CT of abd/pelvis not revealing.  Would benefit from heparin given NSTEMI but need to hold off due to concerns for bleeding.    Palliative care consulted.  Attempted to call Melissa Price 2x (once at 11:00am and once at 12:10pm- previously discussed with her prognosis this am) but have been unable to get in touch with her.  CT of head pending.

## 2017-07-14 NOTE — Progress Notes (Signed)
PROGRESS NOTE    Reginald [name]  EBR:[id number] DOB: January 08, 1956 DOA: 07/12/2017 PCP: Patient, [name] Per    Brief Narrative:  Reginald [name] is a 61 y.o. male with history of intellectual disability, HTN, dysphagia, muscle contractures, anxiety, PNA and polyneuropathy presented to ED today sent from [place] facility for lethargy, hypotension and decreased mobility.  Pocketing food per staff.  Had fever at the SNF.  Got Rocepin 1 gm at the SNF this am before coming here.  Also his BP's were high at the SNF.    At baseline pt is reportedly alert and gives one-word responses typically.  In ED pt is somnolent and not responding verbally.  UA shows sig pyuria and hematuria.  CXR is negative and CT head shows nothing acute.  Got 1 L IVF's in the ED.    Patient is awake, makes eye contact, smiles a bit but no verbal responses.  Doesn't follow any commands.  No family here.    No old admissions in [facility].  Only in [place] is a dental caries procedure from 04/2014 at [facility].  Started on Ceftriaxone.   Assessment & Plan:   Active Problems:   Intellectual disability   Altered mental status   Acute lower UTI   Dehydration   Contractures of both knees   Debility   Bedbound   Essential hypertension  S/p near arrest on 10/18 - intubated - transferred to ICU - troponin x3 ordered - will order CT chest/abd/pelvis given elevated of transaminases - EKG - repeat ABG - Pulmonology/ Dr. Luan Pulling following - repeat CXR - currently on levophed, fentanyl - cannot get MRI 2/2 ventilator  Altered mental status , acute on chronic  - discussed at length with patient's guardian patients decline since last year - per patient's guardian up until a year ago he was walking, talking, feeding himself and continent of bowel and bladder until an acute change - no explanation of acute change 1 year ago - guardian states she does not want anything that would cause discomfort or  agitation to patient  UTI  - urine Cx sent and showing less than 10k colony growth - cont IV Rocephin - will scan patients abdomen and pelvis  Mentally retarded - per patient's guardian patient will have conversations with people he likes but has been declining and would not engage in conversation with her this past week  Debility  - bilat LE contractures, skin in good condition  NOK  - discussed at length with patient's guardian Melissa Price  HTN  - hold antihypertensives given hypotension and levophed   DVT prophylaxis: SCDs and lovenox Code Status: Full code Family Communication: no family bedside, lengthy conversation with patient's guardian Melissa Price Disposition Plan: pending improvement of sodium   Consultants:   SLP  Procedures:   None  Antimicrobials:   Rocephin    Subjective: Patient underwent a near arrest last night.  He was found to be hypotensive, hypoxic and respirations of 8.  He was intubated, placed on levophed and transferred to ICU.  Initial concern for aspiration pneumonia.  Objective: Vitals:   07/14/17 0500 07/14/17 0600 07/14/17 0700 07/14/17 0757  BP: 140/81 127/81 137/74   Pulse:      Resp: _0 Temp:      TempSrc:      SpO2:    100%  Weight: 57.6 kg (126 lb 15.8 oz)     Height:        Intake/Output  Summary (Last 24 hours) at 07/14/17 0847 Last data filed at 07/14/17 0500  Gross per 24 hour  Intake             1650 ml  Output              300 ml  Net             1350 ml   Filed Weights   07/05/2017 2252 07/12/17 0641 07/14/17 0500  Weight: 54.7 kg (120 lb 11.2 oz) 54.4 kg (120 lb) 57.6 kg (126 lb 15.8 oz)    Examination:  General exam: sedated  Respiratory system: intubated. Symmetrical chest rise, no wheezes, or rales.  Significant secretions in ETT Cardiovascular system: S1 & S2 heard, RRR. II/VI systolic murmur appreciated. No JVD, rubs, gallops or clicks. No pedal edema. Gastrointestinal system: Abdomen is  nondistended, soft and nontender. No organomegaly or masses felt. Normal bowel sounds heard. Central nervous system: Not able to assess as patient is sedated Extremities: Contracted upper and lower extremities. Skin: No rashes, lesions or ulcers Psychiatry: sedated    Data Reviewed: I have personally reviewed following labs and imaging studies  CBC:  Recent Labs Lab 07/14/2017 1620 07/12/17 0421 07/13/17 2305  WBC 6.6 8.4 8.0  NEUTROABS 5.5  --   --   HGB 11.6* 11.3* 10.7*  HCT 36.6* 35.6* 32.9*  MCV 89.3 89.4 88.2  PLT 234 259 832   Basic Metabolic Panel:  Recent Labs Lab 07/10/2017 1620 07/12/17 0421 07/13/17 1233 07/13/17 2305 07/14/17 0602  NA 149* 143 134* 133* 130*  K 4.3 4.1 4.2 4.9 4.2  CL 104 100* 92* 90* 88*  CO2 36* 36* 37* 31 31  GLUCOSE 145* 174* 182* 129* 124*  BUN _0 30*  CREATININE 0.40* 0.32* 0.42* 1.48* 1.92*  CALCIUM 9.4 9.1 8.4* 8.3* 8.2*   GFR: Estimated Creatinine Clearance: 33.3 mL/min (A) (by C-G formula based on SCr of 1.92 mg/dL (H)). Liver Function Tests:  Recent Labs Lab 07/14/2017 1620 07/13/17 2305  AST 36 456*  ALT 58 783*  ALKPHOS 167* 163*  BILITOT 0.2* 0.6  PROT 6.8 6.2*  ALBUMIN 3.3* 2.8*   No results for input(s): LIPASE, AMYLASE in the last 168 hours. No results for input(s): AMMONIA in the last 168 hours. Coagulation Profile: No results for input(s): INR, PROTIME in the last 168 hours. Cardiac Enzymes: No results for input(s): CKTOTAL, CKMB, CKMBINDEX, TROPONINI in the last 168 hours. BNP (last 3 results) No results for input(s): PROBNP in the last 8760 hours. HbA1C: No results for input(s): HGBA1C in the last 72 hours. CBG:  Recent Labs Lab 07/13/17 2123  GLUCAP 121*   Lipid Profile: No results for input(s): CHOL, HDL, LDLCALC, TRIG, CHOLHDL, LDLDIRECT in the last 72 hours. Thyroid Function Tests: No results for input(s): TSH, T4TOTAL, FREET4, T3FREE, THYROIDAB in the last 72 hours. Anemia  Panel: No results for input(s): VITAMINB12, FOLATE, FERRITIN, TIBC, IRON, RETICCTPCT in the last 72 hours. Sepsis Labs:  Recent Labs Lab 07/15/2017 1625  LATICACIDVEN 1.1    Recent Results (from the past 240 hour(s))  Urine culture     Status: Abnormal   Collection Time: 07/19/2017  4:36 PM  Result Value Ref Range Status   Specimen Description URINE, CLEAN CATCH  Final   Special Requests NONE  Final   Culture (A)  Final    <10,000 COLONIES/mL INSIGNIFICANT GROWTH Performed at [facility], [address] [place], [place] [address]  Report Status 07/13/2017 FINAL  Final  MRSA PCR Screening     Status: None   Collection Time: 07/20/2017 10:05 PM  Result Value Ref Range Status   MRSA by PCR NEGATIVE NEGATIVE Final    Comment:        The GeneXpert MRSA Assay (FDA approved for NASAL specimens only), is one component of a comprehensive MRSA colonization surveillance program. It is not intended to diagnose MRSA infection nor to guide or monitor treatment for MRSA infections.   Culture, blood (Routine X 2) w Reflex to ID Panel     Status: None (Preliminary result)   Collection Time: 07/13/17 10:58 PM  Result Value Ref Range Status   Specimen Description NECK  Final   Special Requests   Final    BOTTLES DRAWN AEROBIC AND ANAEROBIC Blood Culture adequate volume   Culture NO GROWTH < 12 HOURS  Final   Report Status PENDING  Incomplete  Culture, blood (Routine X 2) w Reflex to ID Panel     Status: None (Preliminary result)   Collection Time: 07/13/17 11:05 PM  Result Value Ref Range Status   Specimen Description BLOOD RIGHT ARM  Final   Special Requests   Final    BOTTLES DRAWN AEROBIC AND ANAEROBIC Blood Culture adequate volume   Culture NO GROWTH < 12 HOURS  Final   Report Status PENDING  Incomplete         Radiology Studies: Dg Chest 1 View  Result Date: 07/13/2017 CLINICAL DATA:  Code blue. Enteric tube, central venous catheter, and endotracheal tube  placements. History of hypertension and pneumonia. EXAM: CHEST 1 VIEW COMPARISON:  07/05/2017 FINDINGS: Endotracheal tube has been placed with tip measuring 3.6 cm above the carina. An enteric tube is been placed. The tip is off the field of view but is below the left hemidiaphragm. A left central venous catheter has been placed. The tip is directed horizontally over the mid right mediastinal region consistent with location at the junction of the brachiocephalic vein and superior vena cava. No pneumothorax. Heart size and pulmonary vascularity are normal for technique. Shallow inspiration. Atelectasis in the lung bases. No airspace disease or consolidation. No pleural effusions. Azygos lobe. Degenerative changes in the spine and shoulders. IMPRESSION: Appliances appear in satisfactory position. Shallow inspiration with atelectasis in the lung bases. No evidence of active pulmonary disease. Electronically Signed   By: Lucienne Capers M.D.   On: 07/13/2017 22:22        Scheduled Meds: . chlorhexidine gluconate (MEDLINE KIT)  15 mL Mouth Rinse BID  . docusate sodium  100 mg Oral BID  . enoxaparin (LOVENOX) injection  40 mg Subcutaneous Q24H  . lisinopril  20 mg Oral Daily  . mouth rinse  15 mL Mouth Rinse QID   Continuous Infusions: . [START ON 07/15/2017] ceFEPime (MAXIPIME) IV    . fentaNYL infusion INTRAVENOUS 75 mcg/hr (07/14/17 0324)  . norepinephrine (LEVOPHED) Adult infusion 11 mcg/min (07/14/17 0520)  . vancomycin       LOS: 0 days    Time spent: 45 minutes critical care time    Loretha Stapler, MD [facility] Pager [phone]  If 7PM-7AM, please contact night-coverage [email or web address] Password [email or web address] 07/14/2017, 8:47 AM

## 2017-07-14 NOTE — Progress Notes (Signed)
Paged Dr. Rinaldo RatelKadolph about ABG PH=7.674, Pco2 = 25.4, PO2=135, Bicarb = 33.1, SAO2 = 99%, PIVC = 22, volume = 500, FIO2= 40%, PEEP = 5. Respiratory stated she is changing settings.

## 2017-07-14 NOTE — Progress Notes (Signed)
SLP Cancellation Note  Patient Details Name: Halina MaidensLonnie Folino MRN: [id number] DOB: 07-27-1956   Cancelled treatment:       Reason Eval/Treat Not Completed: Patient not medically ready;Patient's level of consciousness (Pt intubated. SLP will sign off. Reconsult as appropriate.)   Thank you,  Havery MorosDabney Clydia Nieves, CCC-SLP [phone]    Nivaan Dicenzo 07/14/2017, 12:40 PM

## 2017-07-14 NOTE — Progress Notes (Signed)
Patient had blood pressure of 60/37, bradypnea, and SpO2 of 72% at 2115. Code Blue initiated with Dr Sharl MaLama and Dr Clayborne DanaMesner in attendance.

## 2017-07-14 NOTE — Progress Notes (Signed)
Pt back from CT started having Seizure like activity. HR increased to 160's. BP 210/81. 2 mg Ativan given. Dr. Rinaldo RatelKadolph notified.

## 2017-07-14 NOTE — Progress Notes (Signed)
Blood noted coming from OG Tube. Dr. Rinaldo RatelKadolph notified

## 2017-07-14 NOTE — Consult Note (Addendum)
Consult requested by: [facility], Dr. Adair Patter Consult requested for: Respiratory failure  HPI: This is a 61 year old who has come from a skilled care facility with urinary tract infection although he only had 10,000 colonies in his urine. He was treated for that was noted to be mildly hyponatremic and it looked like he was significantly dehydrated on admission. He was rehydrated seem to be improving with plans for discharge in the near future when he had near cardiopulmonary arrest at about 11:00 last night. He was intubated placed on mechanical ventilation he was noted to be hypotensive and started on pressors. Chest x-ray did not show evidence of pneumonia but there was concern that he may have aspirated. At baseline he has intellectual disabilities dysphagia communication defect contractures.  Past Medical History:  Diagnosis Date  . Abnormal posture   . Anxiety   . Cognitive communication deficit   . Contracture of muscle   . Contracture of muscle   . Dysphagia   . Hypertension   . Insomnia   . Moderate intellectual disabilities   . Pneumonia   . Polyneuropathy      History reviewed. No pertinent family history. His family history is unknown.  Social History   Social History  . Marital status: Single    Spouse name: N/A  . Number of children: N/A  . Years of education: N/A   Social History Main Topics  . Smoking status: Never Smoker  . Smokeless tobacco: Never Used  . Alcohol use No  . Drug use: No  . Sexual activity: No   Other Topics Concern  . None   Social History Narrative  . None     ROS: Unobtainable    Objective: Vital signs in last 24 hours: Temp:  [98 F (36.7 C)-100 F (37.8 C)] 98 F (36.7 C) (10/19 0400) Pulse Rate:  [96-98] 96 (10/18 2119) Resp:  [8-16] 16 (10/19 0700) BP: (60-140)/(37-82) 137/74 (10/19 0700) SpO2:  [72 %-100 %] 100 % (10/19 0757) FiO2 (%):  [40 %-100 %] 40 % (10/19 0757) Weight:  [57.6 kg (126 lb 15.8 oz)] 57.6  kg (126 lb 15.8 oz) (10/19 0500) Weight change:  Last BM Date: 07/06/2017  Intake/Output from previous day: 10/18 0701 - 10/19 0700 In: 1650 [I.V.:1650] Out: 300 [Urine:300]  PHYSICAL EXAM Constitutional: He does move around and response to touching him. Eyes: Pupils react. Ears nose mouth and throat: His mucous membranes are moist now work drawn earlier. Throat is clear. Cardiovascular: His heart is regular with mild tachycardia. Respiratory: He has bilateral prolonged expiratory phase but no wheezing and no rales now. He is intubated. Skin: Warm and dry gastrointestinal: His abdomen feels firm to me but it's hard to tell because he is in an awkward position. Neurological: Not able to be evaluated. Psychiatric: Not able to be evaluated. Skin: Warm and dry with good skin turgor musculoskeletal: Not able to be evaluated  Lab Results: Basic Metabolic Panel:  Recent Labs  07/13/17 2305 07/14/17 0602  NA 133* 130*  K 4.9 4.2  CL 90* 88*  CO2 31 31  GLUCOSE 129* 124*  BUN 19 30*  CREATININE 1.48* 1.92*  CALCIUM 8.3* 8.2*   Liver Function Tests:  Recent Labs  07/01/2017 1620 07/13/17 2305  AST 36 456*  ALT 58 783*  ALKPHOS 167* 163*  BILITOT 0.2* 0.6  PROT 6.8 6.2*  ALBUMIN 3.3* 2.8*   No results for input(s): LIPASE, AMYLASE in the last 72 hours. No results for input(s): AMMONIA  in the last 72 hours. CBC:  Recent Labs  07/12/2017 1620 07/12/17 0421 07/13/17 2305  WBC 6.6 8.4 8.0  NEUTROABS 5.5  --   --   HGB 11.6* 11.3* 10.7*  HCT 36.6* 35.6* 32.9*  MCV 89.3 89.4 88.2  PLT 234 259 162   Cardiac Enzymes: No results for input(s): CKTOTAL, CKMB, CKMBINDEX, TROPONINI in the last 72 hours. BNP: No results for input(s): PROBNP in the last 72 hours. D-Dimer: No results for input(s): DDIMER in the last 72 hours. CBG:  Recent Labs  07/13/17 2123  GLUCAP 121*   Hemoglobin A1C: No results for input(s): HGBA1C in the last 72 hours. Fasting Lipid Panel: No results for  input(s): CHOL, HDL, LDLCALC, TRIG, CHOLHDL, LDLDIRECT in the last 72 hours. Thyroid Function Tests: No results for input(s): TSH, T4TOTAL, FREET4, T3FREE, THYROIDAB in the last 72 hours. Anemia Panel: No results for input(s): VITAMINB12, FOLATE, FERRITIN, TIBC, IRON, RETICCTPCT in the last 72 hours. Coagulation: No results for input(s): LABPROT, INR in the last 72 hours. Urine Drug Screen: Drugs of Abuse  No results found for: LABOPIA, COCAINSCRNUR, LABBENZ, AMPHETMU, THCU, LABBARB  Alcohol Level: No results for input(s): ETH in the last 72 hours. Urinalysis:  Recent Labs  07/04/2017 1636  COLORURINE YELLOW  LABSPEC 1.014  PHURINE 5.0  GLUCOSEU NEGATIVE  HGBUR MODERATE*  BILIRUBINUR NEGATIVE  KETONESUR NEGATIVE  PROTEINUR 30*  NITRITE POSITIVE*  LEUKOCYTESUR LARGE*   Misc. Labs:   ABGS:  Recent Labs  07/13/17 2352  PHART 7.553*  PO2ART 462*  HCO3 31.1*     MICROBIOLOGY: Recent Results (from the past 240 hour(s))  Urine culture     Status: Abnormal   Collection Time: 07/01/2017  4:36 PM  Result Value Ref Range Status   Specimen Description URINE, CLEAN CATCH  Final   Special Requests NONE  Final   Culture (A)  Final    <10,000 COLONIES/mL INSIGNIFICANT GROWTH Performed at [facility], [address] [place], [place] [address]    Report Status 07/13/2017 FINAL  Final  MRSA PCR Screening     Status: None   Collection Time: 07/17/2017 10:05 PM  Result Value Ref Range Status   MRSA by PCR NEGATIVE NEGATIVE Final    Comment:        The GeneXpert MRSA Assay (FDA approved for NASAL specimens only), is one component of a comprehensive MRSA colonization surveillance program. It is not intended to diagnose MRSA infection nor to guide or monitor treatment for MRSA infections.   Culture, blood (Routine X 2) w Reflex to ID Panel     Status: None (Preliminary result)   Collection Time: 07/13/17 10:58 PM  Result Value Ref Range Status   Specimen Description  NECK  Final   Special Requests   Final    BOTTLES DRAWN AEROBIC AND ANAEROBIC Blood Culture adequate volume   Culture NO GROWTH < 12 HOURS  Final   Report Status PENDING  Incomplete  Culture, blood (Routine X 2) w Reflex to ID Panel     Status: None (Preliminary result)   Collection Time: 07/13/17 11:05 PM  Result Value Ref Range Status   Specimen Description BLOOD RIGHT ARM  Final   Special Requests   Final    BOTTLES DRAWN AEROBIC AND ANAEROBIC Blood Culture adequate volume   Culture NO GROWTH < 12 HOURS  Final   Report Status PENDING  Incomplete    Studies/Results: Dg Chest 1 View  Result Date: 07/13/2017 CLINICAL DATA:  Code blue. Enteric tube, central venous catheter, and endotracheal tube placements. History of hypertension and pneumonia. EXAM: CHEST 1 VIEW COMPARISON:  07/03/2017 FINDINGS: Endotracheal tube has been placed with tip measuring 3.6 cm above the carina. An enteric tube is been placed. The tip is off the field of view but is below the left hemidiaphragm. A left central venous catheter has been placed. The tip is directed horizontally over the mid right mediastinal region consistent with location at the junction of the brachiocephalic vein and superior vena cava. No pneumothorax. Heart size and pulmonary vascularity are normal for technique. Shallow inspiration. Atelectasis in the lung bases. No airspace disease or consolidation. No pleural effusions. Azygos lobe. Degenerative changes in the spine and shoulders. IMPRESSION: Appliances appear in satisfactory position. Shallow inspiration with atelectasis in the lung bases. No evidence of active pulmonary disease. Electronically Signed   By: Lucienne Capers M.D.   On: 07/13/2017 22:22    Medications:  Prior to Admission:  Prescriptions Prior to Admission  Medication Sig Dispense Refill Last Dose  . acetaminophen (TYLENOL) 325 MG tablet Take 650 mg by mouth 3 (three) times daily.   07/10/2017 at Unknown time  .  acetaminophen (TYLENOL) 500 MG tablet Take 1,000 mg by mouth every 6 (six) hours as needed for mild pain or moderate pain.   unknown  . ALPRAZolam (XANAX) 0.25 MG tablet Take 0.25 mg by mouth 2 (two) times daily.    07/23/2017 at Unknown time  . aspirin EC 81 MG tablet Take 81 mg by mouth daily.   07/09/2017 at Unknown time  . baclofen (LIORESAL) 10 MG tablet Take 10 mg by mouth 3 (three) times daily.   07/22/2017 at Unknown time  . busPIRone (BUSPAR) 7.5 MG tablet Take 7.5 mg by mouth 2 (two) times daily.   07/26/2017 at Unknown time  . cefTRIAXone (ROCEPHIN) 1 g injection Inject 2 g into the muscle once.   07/19/2017 at Unknown time  . chlorhexidine (PERIDEX) 0.12 % solution Use as directed 15 mLs in the mouth or throat 2 (two) times daily.   07/13/2017 at Unknown time  . cholecalciferol (VITAMIN D) 1000 units tablet Take 1,000 Units by mouth daily.   06/28/2017 at Unknown time  . cloNIDine (CATAPRES) 0.2 MG tablet Take 0.2 mg by mouth once.   07/16/2017 at Unknown time  . gabapentin (NEURONTIN) 600 MG tablet Take 600 mg by mouth at bedtime.    07/10/2017 at Unknown time  . HYDROcodone-acetaminophen (NORCO) 10-325 MG tablet Take 1 tablet by mouth 3 (three) times daily.    07/04/2017 at Unknown time  . lisinopril (PRINIVIL,ZESTRIL) 20 MG tablet Take 20 mg by mouth daily.   07/05/2017 at Unknown time  . Melatonin 3 MG TABS Take 3 mg by mouth at bedtime.   07/10/2017 at Unknown time  . Multiple Vitamin (MULTIVITAMIN WITH MINERALS) TABS tablet Take 1 tablet by mouth daily.   07/15/2017 at Unknown time  . OXYGEN Inhale into the lungs daily.   07/10/2017 at Unknown time   Scheduled: . chlorhexidine gluconate (MEDLINE KIT)  15 mL Mouth Rinse BID  . docusate sodium  100 mg Oral BID  . enoxaparin (LOVENOX) injection  40 mg Subcutaneous Q24H  . lisinopril  20 mg Oral Daily  . mouth rinse  15 mL Mouth Rinse QID   Continuous: . [START ON 07/15/2017] ceFEPime (MAXIPIME) IV    . fentaNYL infusion  INTRAVENOUS 75 mcg/hr (07/14/17 0324)  . norepinephrine (LEVOPHED) Adult infusion 11 mcg/min (07/14/17  0520)  . vancomycin     UWC:[id number] **OR** acetaminophen, fentaNYL, polyethylene glycol, polyvinyl alcohol, sodium phosphate  Assesment: He was admitted with what was thought to be a urinary tract infection and dehydration. He is now intubated on mechanical ventilation hypotensive on levo fed. There is concern that he may have aspirated. He has significant worsening of renal function. He has elevated liver function testing. He is a ward of the state. Hospitalist attending as discussed the situation with his guardian. He has multisystem failure and I think he very well may not survive this episode. I would be in favor of not escalating his care to do CPR etc. Active Problems:   Intellectual disability   Altered mental status   Acute lower UTI   Dehydration   Contractures of both knees   Debility   Bedbound   Essential hypertension    Plan: Discussed with Dr. Adair Patter. Agree with going ahead and getting EKG and troponins although the troponin may be up from demand ischemia. He certainly could have had an acute MI that precipitated this event. Agree with CT of the abdomen and pelvis because of the elevated liver function testing and his exam. Continue broad-spectrum antibiotics. Repeat chest x-ray to see if we have definite evidence of pneumonia. Repeat liver function testing    LOS: 0 days   Yida Hyams L 07/14/2017, 8:54 AM I will be out of town the next two days

## 2017-07-14 NOTE — Clinical Social Work Note (Signed)
Clinical Social Work Assessment  Patient Details  Name: Reginald MaidensLonnie [name] MRN: [id number] Date of Birth: March 09, 1956  Date of referral:  07/14/17               Reason for consult:  Discharge Planning                Permission sought to share information with:    Permission granted to share information::     Name::        Agency::  [name], [place]  Relationship::     Contact Information:     Housing/Transportation Living arrangements for the past 2 months:  [facility] Source of Information:  Facility Patient Interpreter Needed:  None Criminal Activity/Legal Involvement Pertinent to Current Situation/Hospitalization:  No - Comment as needed Significant Relationships:  Siblings Lives with:  Facility Resident Do you feel safe going back to the place where you live?  Yes Need for family participation in patient care:  Yes (Comment)  Care giving concerns:  Patient is a long term resident. Patient is total care.  Staff puts him in his high back wheelchair daily. Patient yells out when he needs stuff and will shake his head yes/no.  He ca return at discharge. Patient [place] ward of the [place] [facility].    Social Worker assessment / plan:  LCSW will follow through discharge.   Employment status:  Disabled (Comment on whether or not currently receiving Disability) Insurance information:  [place]  Not assessed at this time Information / Referral to community resources:     Patient/Family's Response to care:  DSS is aware of hospitalization.   Patient/Family's Understanding of and Emotional Response to Diagnosis, Current Treatment, and Prognosis:  DSS has been made aware of patient's diagnosis, treatment and prognosis.   Emotional Assessment Appearance:    Attitude/Demeanor/Rapport:  Unable to Assess Affect (typically observed):  Unable to Assess Orientation:  Oriented to Self Alcohol / Substance  use:  Not Applicable Psych involvement (Current and /or in the community):  No (Comment)  Discharge Needs  Concerns to be addressed:  No discharge needs identified Readmission within the last 30 days:  No Current discharge risk:  None Barriers to Discharge:  No Barriers Identified   Annice NeedySettle, Khayla Koppenhaver D, LCSW 07/14/2017, 4:12 PM

## 2017-07-14 NOTE — Consult Note (Addendum)
Cardiology Consultation:   Patient ID: Reginald [name]; [id number]; 08-Jan-1956   Admit date: 07/04/2017 Date of Consult: 07/14/2017  Primary Care Provider: Patient, [name] Per Consulting Cardiologist: Reginald [name]   Patient Profile:   Reginald [name] is a 61 y.o. male ward of the state, residing at [facility] with a history of cognitive deficits, hypertension, polyneuropathy, and flexion contractures and bedbound status who is being seen today for the evaluation of abnormal troponin I at the request of Reginald [name].  History of Present Illness:   Reginald [name] was admitted to [facility] from [facility] on October 16 with lethargy and reported blood pressure fluctuations. He was treated initially for suspected dehydration and potential UTI, placed on antibiotics and IV fluids. Chart indicates contact made with patient's guardian. Mental status described as being awake and making eye contact but only single verbal responses at times. Patient subsequently noted to be unresponsive on the evening of October 18, CODE BLUE was called for respiratory arrest. Patient describes to have had heart rate throughout although hypotensive, was intubated, placed on Levophed, transferred to the unit. Patient seen by Reginald [name] for Pulmonary consultation this morning.  I reviewed the patient's chart, records are fairly limited. Follow-up lab work shows worsening renal insufficiency with creatinine up from 1.48-2.26, marked transaminitis with AST of 2036 and ALT of 2552, hyponatremia with sodium 129, hemoglobin 10.7, and troponin I 9.58.blood cultures that have been sent so far are negative.ECG this morning showed sinus rhythm with nonspecific ST-T wave abnormalities, no criteria for STEMI.   Initial head CT from October 16 showed no acute changes with minimal small vessel chronic ischemic changes in the deep cerebral white matter. CT imaging of the chest and abdomen reports trace right pleural effusion with  dependent atelectasis, large stool burden in the colon, normal appendix, no obvious acute findings.  On my evaluation of the patient this morning, he is intubated and sedated, protective mittens on the hands, obvious flexion contractures noted. He developed rapid eyelid twitching and blinking with subsequent shaking consistent with seizure-like activity as well. This has not been reported recently.  Past Medical History:  Diagnosis Date  . Abnormal posture   . Anxiety   . Cognitive communication deficit   . Contracture of muscle   . Dysphagia   . History of pneumonia   . Hypertension   . Insomnia   . Moderate intellectual disabilities   . Polyneuropathy     History reviewed. No pertinent surgical history.   Inpatient Medications: Scheduled Meds: . chlorhexidine gluconate (MEDLINE KIT)  15 mL Mouth Rinse BID  . docusate sodium  100 mg Oral BID  . enoxaparin (LOVENOX) injection  40 mg Subcutaneous Q24H  . LORazepam      . mouth rinse  15 mL Mouth Rinse QID   Continuous Infusions: . [START ON 07/15/2017] ceFEPime (MAXIPIME) IV    . fentaNYL infusion INTRAVENOUS 75 mcg/hr (07/14/17 0324)  . norepinephrine (LEVOPHED) Adult infusion 8 mcg/min (07/14/17 0923)  . vancomycin     PRN Meds: acetaminophen **OR** acetaminophen, fentaNYL, polyethylene glycol, polyvinyl alcohol, sodium phosphate  Allergies:   No Known Allergies  Social History:   Social History   Social History  . Marital status: Single    Spouse name: N/A  . Number of children: N/A  . Years of education: N/A   Occupational History  . Not on file.   Social History Main Topics  . Smoking status: Never Smoker  . Smokeless tobacco: Never  Used  . Alcohol use No  . Drug use: No  . Sexual activity: No   Other Topics Concern  . Not on file   Social History Narrative  . No narrative on file    Family History:   Unable to obtain family history due to patient's current status. None listed in the chart.  ROS:   Unable to obtain review of systems due to patient's current status.  Physical Exam/Data:   Vitals:   07/14/17 0500 07/14/17 0600 07/14/17 0700 07/14/17 0757  BP: 140/81 127/81 137/74   Pulse:      Resp: '16 16 16   ' Temp:      TempSrc:      SpO2:    100%  Weight: 126 lb 15.8 oz (57.6 kg)     Height:        Intake/Output Summary (Last 24 hours) at 07/14/17 1047 Last data filed at 07/14/17 0500  Gross per 24 hour  Intake             1650 ml  Output              300 ml  Net             1350 ml   Filed Weights   06/29/2017 2252 07/12/17 0641 07/14/17 0500  Weight: 120 lb 11.2 oz (54.7 kg) 120 lb (54.4 kg) 126 lb 15.8 oz (57.6 kg)   Body mass index is 21.13 kg/m.   Gen: Chronically ill-appearing male, sedated on the ventilator. HEENT: No scleral icterus. ET tube taped in place. Neck: No obvious JVD or carotid bruits, no thyromegaly. Lungs: Decreased breath sounds without crackles or wheezing. Cardiac: Regular rate and rhythm, no S3, soft systolic murmur. Abdomen: Soft, decreased bowel sounds present, no obvious guarding or rebound. Extremities: No pitting edema, distal pulses 1+. Skin: Warm and dry. Musculoskeletal: Significant flexion contractures noted Neuropsychiatric: Unable to assess with the patient sedated on the ventilator.  EKG:  I personally reviewed the tracing from 07/14/2017 which showed sinus rhythm with nonspecific ST-T changes.  Telemetry:  I personally reviewed telemetry which shows sinus rhythm and sinus tachycardia.  Laboratory Data:  Chemistry Recent Labs Lab 07/13/17 2305 07/14/17 0602 07/14/17 0912  NA 133* 130* 129*  K 4.9 4.2 4.0  CL 90* 88* 89*  CO2 '31 31 30  ' GLUCOSE 129* 124* 119*  BUN 19 30* 35*  CREATININE 1.48* 1.92* 2.26*  CALCIUM 8.3* 8.2* 8.0*  GFRNONAA 50* 36* 30*  GFRAA 58* 42* 35*  ANIONGAP '12 11 10     ' Recent Labs Lab 07/08/2017 1620 07/13/17 2305 07/14/17 0912  PROT 6.8 6.2* 5.7*  ALBUMIN 3.3* 2.8* 2.6*  AST 36 456*  2,036*  ALT 58 783* 2,552*  ALKPHOS 167* 163* 152*  BILITOT 0.2* 0.6 0.6   Hematology Recent Labs Lab 07/22/2017 1620 07/12/17 0421 07/13/17 2305  WBC 6.6 8.4 8.0  RBC 4.10* 3.98* 3.73*  HGB 11.6* 11.3* 10.7*  HCT 36.6* 35.6* 32.9*  MCV 89.3 89.4 88.2  MCH 28.3 28.4 28.7  MCHC 31.7 31.7 32.5  RDW 14.8 14.6 14.3  PLT 234 259 162   Cardiac Enzymes Recent Labs Lab 07/14/17 0912  TROPONINI 9.58*   Radiology/Studies:  Dg Chest 1 View  Result Date: 07/13/2017 CLINICAL DATA:  Code blue. Enteric tube, central venous catheter, and endotracheal tube placements. History of hypertension and pneumonia. EXAM: CHEST 1 VIEW COMPARISON:  07/16/2017 FINDINGS: Endotracheal tube has been placed with tip measuring 3.6 cm above  the carina. An enteric tube is been placed. The tip is off the field of view but is below the left hemidiaphragm. A left central venous catheter has been placed. The tip is directed horizontally over the mid right mediastinal region consistent with location at the junction of the brachiocephalic vein and superior vena cava. No pneumothorax. Heart size and pulmonary vascularity are normal for technique. Shallow inspiration. Atelectasis in the lung bases. No airspace disease or consolidation. No pleural effusions. Azygos lobe. Degenerative changes in the spine and shoulders. IMPRESSION: Appliances appear in satisfactory position. Shallow inspiration with atelectasis in the lung bases. No evidence of active pulmonary disease. Electronically Signed   By: Lucienne Capers M.D.   On: 07/13/2017 22:22   Ct Head Wo Contrast  Result Date: 06/28/2017 CLINICAL DATA:  Lethargy, hypertension, low-grade fever, decreased mobility EXAM: CT HEAD WITHOUT CONTRAST TECHNIQUE: Contiguous axial images were obtained from the base of the skull through the vertex without intravenous contrast. Sagittal and coronal MPR images reconstructed from axial data set. COMPARISON:  11/16/2016 FINDINGS: Brain: Normal  ventricular morphology. No midline shift or mass effect. Minimal small vessel chronic ischemic changes of deep cerebral white matter. Otherwise normal appearance of brain parenchyma. No intracranial hemorrhage, mass lesion or evidence of acute infarction. No extra-axial fluid collections. Vascular: Unremarkable Skull: Intact Sinuses/Orbits: Cleared Other: N/A IMPRESSION: Minimal small vessel chronic ischemic changes of deep cerebral white matter. No acute intracranial abnormalities. No significant interval change. Electronically Signed   By: Lavonia Dana M.D.   On: 07/02/2017 17:23   Dg Chest Port 1 View  Result Date: 07/14/2017 CLINICAL DATA:  Respiratory failure.  Ventilated patient. EXAM: PORTABLE CHEST 1 VIEW COMPARISON:  Single-view of the chest 07/13/2017. FINDINGS: ET tube and NG tube remain in place in good position. Lungs are clear. No pneumothorax or pleural effusion. Heart size is normal. IMPRESSION: Support apparatus projects in good position.  Lungs are clear. Electronically Signed   By: Inge Rise M.D.   On: 07/14/2017 09:44   Dg Chest Portable 1 View  Result Date: 07/04/2017 CLINICAL DATA:  Low-grade fever, lethargy, hypertension, decreased mobility EXAM: PORTABLE CHEST 1 VIEW COMPARISON:  Portable exam 1623 hours without priors for comparison FINDINGS: Normal heart size, mediastinal contours, and pulmonary vascularity. Azygos fissure noted. Minimal RIGHT basilar atelectasis. Lungs otherwise clear. No infiltrate, pleural effusion or pneumothorax. Bones unremarkable. IMPRESSION: Minimal RIGHT basilar atelectasis. Electronically Signed   By: Lavonia Dana M.D.   On: 07/16/2017 16:40    Assessment and Plan:   1. Elevated troponin I consistent with NSTEMI, type I versus type II. Patient is status post recent respiratory arrest as noted above, currently sedated on the ventilator. Also showing evidence of seizure-like activity. Uncertain whether he has had any recent chest discomfort or  not due to recent mental status and limited communication. ECG shows nonspecific ST-T wave abnormalities, no criteria for STEMI.   2. History of hypertension based on chart review.outpatient regimen included clonidine and lisinopril  3. Cognitive impairment and decreased functional status at baseline. Patient has significant flexion contractures and is bedbound, resident of [facility], ward of the state.  4. Respiratory failure, etiology not entirely clear. ReginaldHawkins questioned possible aspiration. Based on his observed seizure activity today, would also question this as a potential cause, although no prior history of seizures based on the chart. He is currently sedated on the ventilator for respiratory support.  5. Possible UTI, continues on antibiotics.blood cultures negative. Urine culture with only limited growth.  6. Marked transaminitis, AST and ALT greater than 2000. Abdominal CT did not show any acute findings. Could be secondary to "shock liver."  7. Acute renal failure, creatinine up to 2.26.  8. Seizure activity.  Patient with complex multisystem organ failure following recent respiratory arrest. Troponin I level is consistent with NSTEMI, but the patient is not a candidate for invasive cardiac evaluation such as cardiac catheterization at this time. Would recommend supportive measures. He is on Lovenox. Add aspirin. Heparin for 48-72 hours would be a consideration, although may need to reimage his head in light of seizure-like activity first. Echocardiogram could be obtained to objectively assess LVEF as a means of guiding medical therapy. Frankly, his prognosis is quite poor based on current status and likelihood of in-hospital mortality is high. Consider discussions regarding Palliative Care and other end-of-life concerns.  Signed, Rozann Lesches, MD  07/14/2017 10:47 AM

## 2017-07-14 NOTE — Consult Note (Signed)
Consultation Note Date: 07/14/2017   Patient Name: Reginald [name]  DOB: 1956/01/13  MRN: [id number]  Age / Sex: 61 y.o., male  PCP: Patient, [name] Per Referring Physician: Eber Jones, MD  Reason for Consultation: Establishing goals of care and Psychosocial/spiritual support  HPI/Patient Profile: 61 y.o. male  with past medical history of moderate intellectual disabilities, hypertension, dysphagia, muscle contracture since being mostly bedbound/wheelchair-bound since December 2017, anxiety admitted on 07/26/2017 with altered mental status UTI, multisystem organ failure after respiratory arrest.   Clinical Assessment and Goals of Care:  Family is requesting that niece, [name], be the main contact person. Her phone number is [phone].   Mr. Maroney is resting quietly in bed, he is intubated/ventilated and sedated. His had a sudden decline overnight, with respiratory arrest requiring intubation. And this morning he was noted to have troponin 9.5.   Conference with DSS social worker, Betsey Amen. Conference with family including sister Mardene Celeste and her daughter [name] ( who is [facility] [employer] nurse), and sister Bethena Roys.   Family seems to understand the severity of Mr. Bekker illness. They have elected to not escalate care, DNR.  Labs and images reviewed in detail with [name]. Paperwork completed for DSS requirements for DNR.   Healthcare power of attorney [place], [place], Betsey Amen [phone] X [phone]   Family is requesting that niece, [name], be the main contact person. Her phone number is [phone].   SUMMARY OF RECOMMENDATIONS   24 to 48 hours for outcomes No CPR, no intubation No surgery No escalation of care  Code Status/Advance Care Planning:  DNR  Symptom Management:   Per hospitalist, no additional needs at this time.  Palliative Prophylaxis:   Turn Reposition  Additional  Recommendations (Limitations, Scope, Preferences):  No Surgical Procedures and No CPR, no intubation  Psycho-social/Spiritual:   Desire for further Chaplaincy support:no  Additional Recommendations: Caregiving  Support/Resources and Education on Hospice  Prognosis:   < 2 weeks, would not be surprising based on multisystem organ failure.   Discharge Planning: Hospital death would not be surprising      Primary Diagnoses: Present on Admission: . Altered mental status . Acute lower UTI . Dehydration . Contractures of both knees . Debility . Essential hypertension   I have reviewed the medical record, interviewed the patient and family, and examined the patient. The following aspects are pertinent.  Past Medical History:  Diagnosis Date  . Abnormal posture   . Anxiety   . Cognitive communication deficit   . Contracture of muscle   . Dysphagia   . History of pneumonia   . Hypertension   . Insomnia   . Moderate intellectual disabilities   . Polyneuropathy    Social History   Social History  . Marital status: Single    Spouse name: N/A  . Number of children: N/A  . Years of education: N/A   Social History Main Topics  . Smoking status: Never Smoker  . Smokeless tobacco: Never Used  . Alcohol use No  .  Drug use: No  . Sexual activity: No   Other Topics Concern  . None   Social History Narrative  . None   Family History  Problem Relation Age of Onset  . Family history unknown: Yes   Scheduled Meds: . chlorhexidine gluconate (MEDLINE KIT)  15 mL Mouth Rinse BID  . docusate sodium  100 mg Oral BID  . enoxaparin (LOVENOX) injection  40 mg Subcutaneous Q24H  . mouth rinse  15 mL Mouth Rinse QID   Continuous Infusions: . sodium chloride    . sodium chloride    . [START ON 07/15/2017] ceFEPime (MAXIPIME) IV    . fentaNYL infusion INTRAVENOUS 75 mcg/hr (07/14/17 0324)  . levETIRAcetam    . levETIRAcetam    . norepinephrine (LEVOPHED) Adult infusion 8  mcg/min (07/14/17 0923)  . vancomycin     PRN Meds:.acetaminophen **OR** acetaminophen, fentaNYL, LORazepam, polyethylene glycol, polyvinyl alcohol, sodium phosphate Medications Prior to Admission:  Prior to Admission medications   Medication Sig Start Date End Date Taking? Authorizing Provider  acetaminophen (TYLENOL) 325 MG tablet Take 650 mg by mouth 3 (three) times daily.   Yes [provider]  acetaminophen (TYLENOL) 500 MG tablet Take 1,000 mg by mouth every 6 (six) hours as needed for mild pain or moderate pain.   Yes [provider]  ALPRAZolam (XANAX) 0.25 MG tablet Take 0.25 mg by mouth 2 (two) times daily.  06/29/2017  Yes [provider]  aspirin EC 81 MG tablet Take 81 mg by mouth daily.   Yes [provider]  baclofen (LIORESAL) 10 MG tablet Take 10 mg by mouth 3 (three) times daily. 06/18/17  Yes [provider]  busPIRone (BUSPAR) 7.5 MG tablet Take 7.5 mg by mouth 2 (two) times daily. 06/15/17  Yes [provider]  cefTRIAXone (ROCEPHIN) 1 g injection Inject 2 g into the muscle once.   Yes [provider]  chlorhexidine (PERIDEX) 0.12 % solution Use as directed 15 mLs in the mouth or throat 2 (two) times daily.   Yes [provider]  cholecalciferol (VITAMIN D) 1000 units tablet Take 1,000 Units by mouth daily.   Yes [provider]  cloNIDine (CATAPRES) 0.2 MG tablet Take 0.2 mg by mouth once.   Yes [provider]  gabapentin (NEURONTIN) 600 MG tablet Take 600 mg by mouth at bedtime.  06/05/17  Yes [provider]  HYDROcodone-acetaminophen (NORCO) 10-325 MG tablet Take 1 tablet by mouth 3 (three) times daily.  06/09/17  Yes [provider]  lisinopril (PRINIVIL,ZESTRIL) 20 MG tablet Take 20 mg by mouth daily. 06/26/17  Yes [provider]  Melatonin 3 MG TABS Take 3 mg by mouth at bedtime.   Yes [provider]  Multiple Vitamin (MULTIVITAMIN WITH MINERALS)  TABS tablet Take 1 tablet by mouth daily.   Yes [provider]  OXYGEN Inhale into the lungs daily.   Yes [provider]   No Known Allergies Review of Systems  Unable to perform ROS: Intubated    Physical Exam  Constitutional: No distress.  Intubated/ventilated, sedated  HENT:  Head: Atraumatic.  Cardiovascular: Normal rate.   Pulmonary/Chest:  Intubated/ventilated  Abdominal: Soft. He exhibits no distension.  Musculoskeletal: He exhibits no edema.  Neurological:  Sedated, intubated  Skin: Skin is warm and dry.  Nursing note and vitals reviewed.   Vital Signs: BP 132/79   Pulse 89   Temp 98.1 F (36.7 C) (Axillary)   Resp 14   Ht  '5\' 5"'  (1.651 m)   Wt 57.6 kg (126 lb 15.8 oz)   SpO2 100%   BMI 21.13 kg/m  Pain Assessment: PAINAD       SpO2: SpO2: 100 % O2 Device:SpO2: 100 % O2 Flow Rate: .   IO: Intake/output summary:  Intake/Output Summary (Last 24 hours) at 07/14/17 1127 Last data filed at 07/14/17 1100  Gross per 24 hour  Intake          1864.33 ml  Output              300 ml  Net          1564.33 ml    LBM: Last BM Date: 07/03/2017 Baseline Weight: Weight: 54.4 kg (120 lb) Most recent weight: Weight: 57.6 kg (126 lb 15.8 oz)     Palliative Assessment/Data:   Flowsheet Rows     Most Recent Value  Intake Tab  Referral Department  Hospitalist  Unit at Time of Referral  ICU  Palliative Care Primary Diagnosis  Cardiac  Date Notified  07/14/17  Palliative Care Type  New Palliative care  Reason for referral  Clarify Goals of Care, End of Life Care Assistance  Date of Admission  07/06/2017  Date first seen by Palliative Care  07/14/17  # of days Palliative referral response time  0 Day(s)  # of days IP prior to Palliative referral  3  Clinical Assessment  Palliative Performance Scale Score  10%  Pain Max last 24 hours  Not able to report  Pain Min Last 24 hours  Not able to report  Dyspnea Max Last 24 Hours  Not able to report    Dyspnea Min Last 24 hours  Not able to report  Psychosocial & Spiritual Assessment  Palliative Care Outcomes  Patient/Family meeting held?  No [LM for DSS SW ]      Time In: 0850 Time Out: 1010 Time Total: 80 minutes Greater than 50%  of this time was spent counseling and coordinating care related to the above assessment and plan.  Signed by: Drue Novel, NP   Please contact Palliative Medicine Team phone at [phone] for questions and concerns.  For individual provider: See Shea Evans

## 2017-07-14 NOTE — Progress Notes (Signed)
[facility] attempted through answering service [employer]; left message with operator 3 @[phone]  and operator 9 @ [phone]

## 2017-07-14 NOTE — Progress Notes (Signed)
Pharmacy Antibiotic Note  Reginald [name] is [name] 61 y.o. male admitted on 07/08/2017 with sepsis.  Pharmacy has been consulted for Vancomycin and Cefepime dosing.  Worsening SCr.    Plan:  Change Vancomycin to 1000mg  IV q24h  Check trough at steady state Change Cefepime to 2gm IV q24h Monitor labs, renal fxn, progress and c/s Deescalate ABX when improved / appropriate.    Height: 5\' 5"  (165.1 cm) Weight: 126 lb 15.8 oz (57.6 kg) IBW/kg (Calculated) : 61.5  Temp (24hrs), Avg:99 F (37.2 C), Min:98 F (36.7 C), Max:100 F (37.8 C)   Recent Labs Lab 07/12/2017 1620 06/30/2017 1625 07/12/17 0421 07/13/17 1233 07/13/17 2305 07/14/17 0602 07/14/17 0912  WBC 6.6  --  8.4  --  8.0  --   --   CREATININE 0.40*  --  0.32* 0.42* 1.48* 1.92* 2.26*  LATICACIDVEN  --  1.1  --   --   --   --   --     Estimated Creatinine Clearance: 28.3 mL/min ([name]) (by C-G formula based on SCr of 2.26 mg/dL (H)).    No Known Allergies  Antimicrobials this admission: Cefepime 10/18 >>  Vancomycin 10/18 >>   Dose adjustments this admission:  10/19 reduced dosing due to worsening SCr  Microbiology results:  BCx: pending  UCx: pending   Sputum:    MRSA PCR:   Thank you for allowing pharmacy to be [name] part of this patient's care.  Reginald [name], Reginald [name] 07/14/2017 10:33 AM

## 2017-07-15 ENCOUNTER — Inpatient Hospital Stay (HOSPITAL_COMMUNITY): Payer: Medicare Other

## 2017-07-15 DIAGNOSIS — R0681 Apnea, not elsewhere classified: Secondary | ICD-10-CM

## 2017-07-15 DIAGNOSIS — I214 Non-ST elevation (NSTEMI) myocardial infarction: Secondary | ICD-10-CM

## 2017-07-15 LAB — MRSA CULTURE: CULTURE: NOT DETECTED

## 2017-07-15 LAB — BLOOD GAS, ARTERIAL
Acid-Base Excess: 5.6 mmol/L — ABNORMAL HIGH (ref 0.0–2.0)
Bicarbonate: 29.7 mmol/L — ABNORMAL HIGH (ref 20.0–28.0)
Drawn by: 28459
FIO2: 40
LHR: 12 {breaths}/min
MECHVT: 500 mL
O2 Saturation: 99 %
PEEP/CPAP: 3 cmH2O
PO2 ART: 179 mmHg — AB (ref 83.0–108.0)
Patient temperature: 37
pCO2 arterial: 37.6 mmHg (ref 32.0–48.0)
pH, Arterial: 7.499 — ABNORMAL HIGH (ref 7.350–7.450)

## 2017-07-15 LAB — BASIC METABOLIC PANEL
ANION GAP: 11 (ref 5–15)
BUN: 41 mg/dL — AB (ref 6–20)
CHLORIDE: 96 mmol/L — AB (ref 101–111)
CO2: 28 mmol/L (ref 22–32)
Calcium: 7.6 mg/dL — ABNORMAL LOW (ref 8.9–10.3)
Creatinine, Ser: 2.15 mg/dL — ABNORMAL HIGH (ref 0.61–1.24)
GFR calc Af Amer: 37 mL/min — ABNORMAL LOW (ref >60)
GFR calc non Af Amer: 32 mL/min — ABNORMAL LOW (ref >60)
GLUCOSE: 98 mg/dL (ref 65–99)
POTASSIUM: 3.4 mmol/L — AB (ref 3.5–5.1)
Sodium: 135 mmol/L (ref 135–145)

## 2017-07-15 LAB — TROPONIN I: TROPONIN I: 5.76 ng/mL — AB (ref <0.03)

## 2017-07-15 MED ORDER — DEXTROSE 5 % IV SOLN
1.0000 g | INTRAVENOUS | Status: DC
  Administered 2017-07-16 – 2017-07-19 (×4): 1 g via INTRAVENOUS
  Filled 2017-07-15 (×4): qty 1

## 2017-07-15 MED ORDER — LEVETIRACETAM IN NACL 500 MG/100ML IV SOLN
500.0000 mg | Freq: Two times a day (BID) | INTRAVENOUS | Status: DC
  Administered 2017-07-15 – 2017-07-17 (×4): 500 mg via INTRAVENOUS
  Filled 2017-07-15 (×6): qty 100

## 2017-07-15 MED ORDER — SODIUM CHLORIDE 0.9 % IV SOLN
500.0000 mg | INTRAVENOUS | Status: DC
Start: 2017-07-15 — End: 2017-07-18
  Administered 2017-07-15 – 2017-07-17 (×3): 500 mg via INTRAVENOUS
  Filled 2017-07-15 (×4): qty 500

## 2017-07-15 NOTE — Progress Notes (Signed)
CRITICAL VALUE ALERT  Critical Value:  Trop 5.76 Date & Time Notied:  07/15/17 0645  Provider Notified: Sharl MaLama  Orders Received/Actions taken: No new orders

## 2017-07-15 NOTE — Plan of Care (Signed)
Problem: Tissue Perfusion: Goal: Risk factors for ineffective tissue perfusion will decrease Outcome: Progressing Pt receiving Lovenox for dvt

## 2017-07-15 NOTE — Progress Notes (Signed)
Bite remains. No signs of breakdown. Bite blocked moved from right side of mouth to left. Pt tolerated well.

## 2017-07-15 NOTE — Progress Notes (Signed)
PROGRESS NOTE    Reginald [name]  ONG:[id number] DOB: 1956-04-30 DOA: 07/25/2017 PCP: Patient, [name] Per    Brief Narrative:  Reginald [name] is a 61 y.o. male with history of intellectual disability, HTN, dysphagia, muscle contractures, anxiety, PNA and polyneuropathy presented to ED today sent from [facility] facility for lethargy, hypotension and decreased mobility.  Pocketing food per staff.  Had fever at the SNF.  Got Rocepin 1 gm at the SNF this am before coming here.  Also his BP's were high at the SNF.    At baseline pt is reportedly alert and gives one-word responses typically.  In ED pt is somnolent and not responding verbally.  UA shows sig pyuria and hematuria.  CXR is negative and CT head shows nothing acute.  Got 1 L IVF's in the ED.    Patient is awake, makes eye contact, smiles a bit but no verbal responses.  Doesn't follow any commands.  No family here.    No old admissions in [facility].  Only in [place] is a dental caries procedure from 04/2014 at [facility].  Started on Ceftriaxone.  Somewhat improved but had a near arrest on 10/18 requiring intubation and was placed on levophed and transferred to the ICU.  He began having seizure like activity on 10/19 and was loaded with keppra.  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  Family meeting with palliative care medicine, legal guardian and family on 10/19.  Patient was made a DNR.   Assessment & Plan:   Active Problems:   Intellectual disability   Altered mental status   Acute lower UTI   Dehydration   Contractures of both knees   Debility   Bedbound   Essential hypertension   Respiratory failure (HCC)   Palliative care encounter   Goals of care, counseling/discussion   Apnea   NSTEMI (non-ST elevated myocardial infarction) ([place])  S/p near arrest on 10/18 - intubated - troponin of 9.5- cardiology consulted - patient not a candidate for aggressive intervention - CT chest/abd/pelvis  given elevated of transaminases but CT unrevealing - repeat ABG - Pulmonology/ Dr. Luan Pulling following - currently on levophed, fentanyl: weaning levophed as tolerated - cannot get MRI 2/2 ventilator  Altered mental status , acute on chronic  - discussed at length with patient's guardian patients decline since last year - per patient's guardian up until a year ago he was walking, talking, feeding himself and continent of bowel and bladder until an acute change - no explanation of acute change 1 year ago - guardian states she does not want anything that would cause discomfort or agitation to patient - family discussion with guardian and family on 10/19 patient made DNR - 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  - cannot get MRI due to ventilator  UTI  - urine Cx sent and showing less than 10k colony growth - CT of abd/ pelvis not showing any acute processes - rocephin stopped  Mentally retarded - per patient's guardian patient will have conversations with people he likes but has been declining and would not engage in conversation with her this past week  Debility  - bilat LE contractures, skin in good condition  NOK  - discussed at length with patient's guardian Melissa Price  HTN  - hold antihypertensives - currently on levophed   DVT prophylaxis: SCDs and lovenox Code Status: Full code Family Communication: no family bedside, lengthy conversation with patient's guardian Betsey Amen Disposition Plan: patient prognosis guarded-  not candidate for aggressive intervention but guardian does not want that anyways.  Cannot extubate or even attempt without court order   Consultants:   SLP  PCCM  Cardiology  Pulmonology  Neurology by phone  Procedures:   Central line placement  Antimicrobials:   Rocephin    Subjective: Patient sedated in ICU.  Objective: Vitals:   07/15/17 0700 07/15/17 0800 07/15/17 0855 07/15/17 0900  BP: (!) 112/59 135/62  129/63    Pulse:    96  Resp: _0 Temp: 99.1 F (37.3 C) 99.1 F (37.3 C)  99.4 F (37.4 C)  TempSrc:      SpO2:   100% 100%  Weight:      Height:        Intake/Output Summary (Last 24 hours) at 07/15/17 0918 Last data filed at 07/15/17 0900  Gross per 24 hour  Intake          4282.58 ml  Output             1800 ml  Net          2482.58 ml   Filed Weights   07/12/17 0641 07/14/17 0500 07/15/17 0400  Weight: 54.4 kg (120 lb) 57.6 kg (126 lb 15.8 oz) 57.6 kg (126 lb 15.8 oz)    Examination:  General exam: sedated  Respiratory system: intubated. Symmetrical chest rise, no wheezes, or rales.  Significant secretions in ETT but not in cannister on wall Cardiovascular system: S1 & S2 heard, RRR. II/VI systolic murmur appreciated. No JVD, rubs, gallops or clicks. No pedal edema. Gastrointestinal system: Abdomen is nondistended, soft and nontender. No organomegaly or masses felt. Normal bowel sounds heard. Central nervous system: Not able to assess as patient is sedated. Was moving legs slightly in the bed during exam but not on command Extremities: Contracted upper and lower extremities. Skin: No rashes, lesions or ulcers Psychiatry: sedated    Data Reviewed: I have personally reviewed following labs and imaging studies  CBC:  Recent Labs Lab 07/14/2017 1620 07/12/17 0421 07/13/17 2305  WBC 6.6 8.4 8.0  NEUTROABS 5.5  --   --   HGB 11.6* 11.3* 10.7*  HCT 36.6* 35.6* 32.9*  MCV 89.3 89.4 88.2  PLT 234 259 884   Basic Metabolic Panel:  Recent Labs Lab 07/13/17 2305 07/14/17 0602 07/14/17 0912 07/14/17 1834 07/15/17 0425  NA 133* 130* 129* 127* 135  K 4.9 4.2 4.0 3.6 3.4*  CL 90* 88* 89* 89* 96*  CO2 _1 GLUCOSE 129* 124* 119* 126* 98  BUN 19 30* 35* 39* 41*  CREATININE 1.48* 1.92* 2.26* 2.37* 2.15*  CALCIUM 8.3* 8.2* 8.0* 7.8* 7.6*   GFR: Estimated Creatinine Clearance: 29.8 mL/min (A) (by C-G formula based on SCr of 2.15 mg/dL (H)). Liver  Function Tests:  Recent Labs Lab 07/06/2017 1620 07/13/17 2305 07/14/17 0912  AST 36 456* 2,036*  ALT 58 783* 2,552*  ALKPHOS 167* 163* 152*  BILITOT 0.2* 0.6 0.6  PROT 6.8 6.2* 5.7*  ALBUMIN 3.3* 2.8* 2.6*   No results for input(s): LIPASE, AMYLASE in the last 168 hours. No results for input(s): AMMONIA in the last 168 hours. Coagulation Profile: No results for input(s): INR, PROTIME in the last 168 hours. Cardiac Enzymes:  Recent Labs Lab 07/14/17 0912 07/15/17 0425  TROPONINI 9.58* 5.76*   BNP (last 3 results) No results for input(s): PROBNP in the last 8760 hours. HbA1C: No results for input(s): HGBA1C in  the last 72 hours. CBG:  Recent Labs Lab 07/13/17 2123  GLUCAP 121*   Lipid Profile: No results for input(s): CHOL, HDL, LDLCALC, TRIG, CHOLHDL, LDLDIRECT in the last 72 hours. Thyroid Function Tests: No results for input(s): TSH, T4TOTAL, FREET4, T3FREE, THYROIDAB in the last 72 hours. Anemia Panel: No results for input(s): VITAMINB12, FOLATE, FERRITIN, TIBC, IRON, RETICCTPCT in the last 72 hours. Sepsis Labs:  Recent Labs Lab 06/27/2017 1625  LATICACIDVEN 1.1    Recent Results (from the past 240 hour(s))  Urine culture     Status: Abnormal   Collection Time: 07/16/2017  4:36 PM  Result Value Ref Range Status   Specimen Description URINE, CLEAN CATCH  Final   Special Requests NONE  Final   Culture (A)  Final    <10,000 COLONIES/mL INSIGNIFICANT GROWTH Performed at [facility], [address], [place], [place] [address]    Report Status 07/13/2017 FINAL  Final  MRSA PCR Screening     Status: None   Collection Time: 07/05/2017 10:05 PM  Result Value Ref Range Status   MRSA by PCR NEGATIVE NEGATIVE Final    Comment:        The GeneXpert MRSA Assay (FDA approved for NASAL specimens only), is one component of a comprehensive MRSA colonization surveillance program. It is not intended to diagnose MRSA infection nor to guide or monitor treatment  for MRSA infections.   Culture, blood (Routine X 2) w Reflex to ID Panel     Status: None (Preliminary result)   Collection Time: 07/13/17 10:58 PM  Result Value Ref Range Status   Specimen Description NECK  Final   Special Requests   Final    BOTTLES DRAWN AEROBIC AND ANAEROBIC Blood Culture adequate volume   Culture NO GROWTH < 12 HOURS  Final   Report Status PENDING  Incomplete  Culture, blood (Routine X 2) w Reflex to ID Panel     Status: None (Preliminary result)   Collection Time: 07/13/17 11:05 PM  Result Value Ref Range Status   Specimen Description BLOOD RIGHT ARM  Final   Special Requests   Final    BOTTLES DRAWN AEROBIC AND ANAEROBIC Blood Culture adequate volume   Culture NO GROWTH < 12 HOURS  Final   Report Status PENDING  Incomplete  MRSA PCR Screening     Status: Abnormal   Collection Time: 07/14/17  7:39 AM  Result Value Ref Range Status   MRSA by PCR INVALID RESULTS, SPECIMEN SENT FOR CULTURE (A) NEGATIVE Final    Comment: [address] AT [id number] [place] [id number]        The GeneXpert MRSA Assay (FDA approved for NASAL specimens only), is one component of a comprehensive MRSA colonization surveillance program. It is not intended to diagnose MRSA infection nor to guide or monitor treatment for MRSA infections.          Radiology Studies: Ct Abdomen Pelvis Wo Contrast  Result Date: 07/14/2017 CLINICAL DATA:  Abnormal liver function tests.  Possible aspiration. EXAM: CT CHEST, ABDOMEN AND PELVIS WITHOUT CONTRAST TECHNIQUE: Multidetector CT imaging of the chest, abdomen and pelvis was performed following the standard protocol without IV contrast. COMPARISON:  None. FINDINGS: CT CHEST FINDINGS Cardiovascular: Heart is normal size. Aorta is normal caliber. Scattered aortic arch calcifications. Mediastinum/Nodes: No mediastinal, hilar, or axillary adenopathy. Endotracheal tube is in place with the tip in the mid to lower trachea. Lungs/Pleura: Dependent  bibasilar opacities bilaterally, right greater than left, favor atelectasis.  Trace right pleural effusion. Musculoskeletal: No acute bony abnormality. CT ABDOMEN PELVIS FINDINGS Hepatobiliary: No focal hepatic abnormality. Gallbladder unremarkable. Pancreas: No focal abnormality or ductal dilatation. Spleen: No focal abnormality.  Normal size. Adrenals/Urinary Tract: No adrenal abnormality. No focal renal abnormality. No stones or hydronephrosis. Urinary bladder is unremarkable. Stomach/Bowel: Moderate to large stool burden throughout the colon. No evidence of bowel obstruction. Appendix is normal. Vascular/Lymphatic: No evidence of aneurysm or adenopathy. Reproductive: No visible focal abnormality. Other: No free fluid or free air. Musculoskeletal: No acute bony abnormality. Degenerative facet disease in the lumbar spine. IMPRESSION: Trace right pleural effusion with dependent opacities in both lung bases, favor dependent atelectasis. Large stool burden throughout the colon. Normal appendix. No acute findings in the chest, no acute findings in the abdomen or pelvis. Electronically Signed   By: Rolm Baptise M.D.   On: 07/14/2017 10:47   Dg Chest 1 View  Result Date: 07/13/2017 CLINICAL DATA:  Code blue. Enteric tube, central venous catheter, and endotracheal tube placements. History of hypertension and pneumonia. EXAM: CHEST 1 VIEW COMPARISON:  07/08/2017 FINDINGS: Endotracheal tube has been placed with tip measuring 3.6 cm above the carina. An enteric tube is been placed. The tip is off the field of view but is below the left hemidiaphragm. A left central venous catheter has been placed. The tip is directed horizontally over the mid right mediastinal region consistent with location at the junction of the brachiocephalic vein and superior vena cava. No pneumothorax. Heart size and pulmonary vascularity are normal for technique. Shallow inspiration. Atelectasis in the lung bases. No airspace disease or  consolidation. No pleural effusions. Azygos lobe. Degenerative changes in the spine and shoulders. IMPRESSION: Appliances appear in satisfactory position. Shallow inspiration with atelectasis in the lung bases. No evidence of active pulmonary disease. Electronically Signed   By: Lucienne Capers M.D.   On: 07/13/2017 22:22   Ct Head Wo Contrast  Result Date: 07/14/2017 CLINICAL DATA:  New onset seizure. EXAM: CT HEAD WITHOUT CONTRAST TECHNIQUE: Contiguous axial images were obtained from the base of the skull through the vertex without intravenous contrast. COMPARISON:  07/06/2017 FINDINGS: Brain: New areas of hypodensity in the cerebellum bilaterally right greater than left. The appearance is most consistent with acute/subacute infarction. No associated hemorrhage. Mild local mass-effect on the fourth ventricle. Negative for hydrocephalus. Mild hypodensity in the cerebral white matter bilaterally with a chronic appearance. Vascular: Negative for hyperdense vessel Skull: Negative Sinuses/Orbits: Negative Other: None IMPRESSION: New areas of ill-defined hypodensity in the cerebellum bilaterally, right greater than left. This is most consistent with acute/subacute infarction. No hemorrhage. MRI suggested to confirm. Electronically Signed   By: Franchot Gallo M.D.   On: 07/14/2017 12:25   Ct Chest Wo Contrast  Result Date: 07/14/2017 CLINICAL DATA:  Abnormal liver function tests.  Possible aspiration. EXAM: CT CHEST, ABDOMEN AND PELVIS WITHOUT CONTRAST TECHNIQUE: Multidetector CT imaging of the chest, abdomen and pelvis was performed following the standard protocol without IV contrast. COMPARISON:  None. FINDINGS: CT CHEST FINDINGS Cardiovascular: Heart is normal size. Aorta is normal caliber. Scattered aortic arch calcifications. Mediastinum/Nodes: No mediastinal, hilar, or axillary adenopathy. Endotracheal tube is in place with the tip in the mid to lower trachea. Lungs/Pleura: Dependent bibasilar opacities  bilaterally, right greater than left, favor atelectasis. Trace right pleural effusion. Musculoskeletal: No acute bony abnormality. CT ABDOMEN PELVIS FINDINGS Hepatobiliary: No focal hepatic abnormality. Gallbladder unremarkable. Pancreas: No focal abnormality or ductal dilatation. Spleen: No focal abnormality.  Normal size. Adrenals/Urinary Tract:  No adrenal abnormality. No focal renal abnormality. No stones or hydronephrosis. Urinary bladder is unremarkable. Stomach/Bowel: Moderate to large stool burden throughout the colon. No evidence of bowel obstruction. Appendix is normal. Vascular/Lymphatic: No evidence of aneurysm or adenopathy. Reproductive: No visible focal abnormality. Other: No free fluid or free air. Musculoskeletal: No acute bony abnormality. Degenerative facet disease in the lumbar spine. IMPRESSION: Trace right pleural effusion with dependent opacities in both lung bases, favor dependent atelectasis. Large stool burden throughout the colon. Normal appendix. No acute findings in the chest, no acute findings in the abdomen or pelvis. Electronically Signed   By: Rolm Baptise M.D.   On: 07/14/2017 10:47   Dg Chest Port 1 View  Result Date: 07/15/2017 CLINICAL DATA:  Respiratory difficulty, follow-up effusion EXAM: PORTABLE CHEST 1 VIEW COMPARISON:  07/14/2017 FINDINGS: Cardiac shadow is stable. Endotracheal tube and orogastric catheter are noted within the stomach. Patient is rotated accentuating the mediastinal markings. Left jugular central line is again seen. The lungs are clear bilaterally no bony abnormality is seen. IMPRESSION: No acute abnormality noted. Tubes and lines as described. Electronically Signed   By: Inez Catalina M.D.   On: 07/15/2017 07:45   Dg Chest Port 1 View  Result Date: 07/14/2017 CLINICAL DATA:  Respiratory failure.  Ventilated patient. EXAM: PORTABLE CHEST 1 VIEW COMPARISON:  Single-view of the chest 07/13/2017. FINDINGS: ET tube and NG tube remain in place in good  position. Lungs are clear. No pneumothorax or pleural effusion. Heart size is normal. IMPRESSION: Support apparatus projects in good position.  Lungs are clear. Electronically Signed   By: Inge Rise M.D.   On: 07/14/2017 09:44        Scheduled Meds: . chlorhexidine gluconate (MEDLINE KIT)  15 mL Mouth Rinse BID  . Chlorhexidine Gluconate Cloth  6 each Topical Q0600  . docusate sodium  100 mg Oral BID  . enoxaparin (LOVENOX) injection  30 mg Subcutaneous Q24H  . mouth rinse  15 mL Mouth Rinse QID  . mupirocin ointment  1 application Nasal BID  . pantoprazole (PROTONIX) IV  40 mg Intravenous Q12H   Continuous Infusions: . sodium chloride 75 mL/hr (07/14/17 1100)  . sodium chloride 75 mL/hr (07/15/17 0844)  . ceFEPime (MAXIPIME) IV 2 g (07/15/17 0548)  . fentaNYL infusion INTRAVENOUS 75 mcg/hr (07/15/17 0548)  . levETIRAcetam 500 mg (07/14/17 2300)  . norepinephrine (LEVOPHED) Adult infusion 4 mcg/min (07/14/17 1753)  . vancomycin 1,000 mg (07/14/17 1806)     LOS: 1 day    Time spent: 35 minutes critical care time    Loretha Stapler, MD [facility] Pager [phone]  If 7PM-7AM, please contact night-coverage [email or web address] Password [email or web address] 07/15/2017, 9:18 AM

## 2017-07-15 NOTE — Progress Notes (Signed)
Pharmacy Antibiotic Note  Reginald MaidensLonnie [name] is a 61 y.o. male admitted on 07/10/2017 with sepsis.  Pharmacy has been consulted for Vancomycin and Cefepime dosing.  Worsening SCr.    Plan:  Change Vancomycin to 500 mg IV q24h  Check trough at steady state Change Cefepime to 1 gm IV q24h Monitor labs, renal fxn, progress and c/s Deescalate ABX when improved / appropriate.    Height: 5\' 5"  (165.1 cm) Weight: 126 lb 15.8 oz (57.6 kg) IBW/kg (Calculated) : 61.5  Temp (24hrs), Avg:100.4 F (38 C), Min:98.9 F (37.2 C), Max:101.9 F (38.8 C)   Recent Labs Lab 07/14/2017 1620 07/03/2017 1625 07/12/17 0421  07/13/17 2305 07/14/17 0602 07/14/17 0912 07/14/17 1834 07/15/17 0425  WBC 6.6  --  8.4  --  8.0  --   --   --   --   CREATININE 0.40*  --  0.32*  < > 1.48* 1.92* 2.26* 2.37* 2.15*  LATICACIDVEN  --  1.1  --   --   --   --   --   --   --   < > = values in this interval not displayed.  Estimated Creatinine Clearance: 29.8 mL/min (A) (by C-G formula based on SCr of 2.15 mg/dL (H)).    No Known Allergies  Antimicrobials this admission: Cefepime 10/18 >>  Vancomycin 10/18 >>   Dose adjustments this admission:  10/19 reduced dosing due to worsening SCr                                                               [facility]  Microbiology results:  BCx: pending  UCx: pending   Sputum:    MRSA PCR:   Thank you for allowing pharmacy to be a part of this patient's care.  Josephine Igoittman, Carlinda Ohlson Bennett 07/15/2017 10:43 AM

## 2017-07-16 LAB — BASIC METABOLIC PANEL
Anion gap: 11 (ref 5–15)
BUN: 41 mg/dL — AB (ref 6–20)
CALCIUM: 7.8 mg/dL — AB (ref 8.9–10.3)
CO2: 27 mmol/L (ref 22–32)
CREATININE: 1.72 mg/dL — AB (ref 0.61–1.24)
Chloride: 105 mmol/L (ref 101–111)
GFR calc Af Amer: 48 mL/min — ABNORMAL LOW (ref >60)
GFR, EST NON AFRICAN AMERICAN: 41 mL/min — AB (ref >60)
GLUCOSE: 73 mg/dL (ref 65–99)
Potassium: 3.1 mmol/L — ABNORMAL LOW (ref 3.5–5.1)
Sodium: 143 mmol/L (ref 135–145)

## 2017-07-16 LAB — CBC WITH DIFFERENTIAL/PLATELET
Basophils Absolute: 0 10*3/uL (ref 0.0–0.1)
Basophils Absolute: 0 10*3/uL (ref 0.0–0.1)
Basophils Relative: 0 %
Basophils Relative: 0 %
EOS ABS: 0.1 10*3/uL (ref 0.0–0.7)
EOS PCT: 1 %
Eosinophils Absolute: 0 10*3/uL (ref 0.0–0.7)
Eosinophils Relative: 0 %
HCT: 24.5 % — ABNORMAL LOW (ref 39.0–52.0)
HEMATOCRIT: 25.6 % — AB (ref 39.0–52.0)
HEMOGLOBIN: 9 g/dL — AB (ref 13.0–17.0)
HEMOGLOBIN: 8.6 g/dL — AB (ref 13.0–17.0)
LYMPHS ABS: 0.5 10*3/uL — AB (ref 0.7–4.0)
LYMPHS ABS: 0.6 10*3/uL — AB (ref 0.7–4.0)
Lymphocytes Relative: 5 %
Lymphocytes Relative: 7 %
MCH: 28.7 pg (ref 26.0–34.0)
MCH: 28.9 pg (ref 26.0–34.0)
MCHC: 35.2 g/dL (ref 30.0–36.0)
MCHC: 35.1 g/dL (ref 30.0–36.0)
MCV: 81.5 fL (ref 78.0–100.0)
MCV: 82.2 fL (ref 78.0–100.0)
MONO ABS: 0.6 10*3/uL (ref 0.1–1.0)
MONO ABS: 0.7 10*3/uL (ref 0.1–1.0)
MONOS PCT: 7 %
MONOS PCT: 9 %
NEUTROS ABS: 7.7 10*3/uL (ref 1.7–7.7)
NEUTROS PCT: 88 %
Neutro Abs: 6.4 10*3/uL (ref 1.7–7.7)
Neutrophils Relative %: 83 %
PLATELETS: 135 10*3/uL — AB (ref 150–400)
Platelets: 148 10*3/uL — ABNORMAL LOW (ref 150–400)
RBC: 3.14 MIL/uL — ABNORMAL LOW (ref 4.22–5.81)
RBC: 2.98 MIL/uL — ABNORMAL LOW (ref 4.22–5.81)
RDW: 14.4 % (ref 11.5–15.5)
RDW: 14.3 % (ref 11.5–15.5)
WBC: 8.8 10*3/uL (ref 4.0–10.5)
WBC: 7.7 10*3/uL (ref 4.0–10.5)

## 2017-07-16 MED ORDER — ASPIRIN 300 MG RE SUPP
300.0000 mg | Freq: Once | RECTAL | Status: AC
  Administered 2017-07-16: 300 mg via RECTAL
  Filled 2017-07-16: qty 1

## 2017-07-16 MED ORDER — SODIUM CHLORIDE 0.9% FLUSH
10.0000 mL | Freq: Two times a day (BID) | INTRAVENOUS | Status: DC
  Administered 2017-07-16 – 2017-07-17 (×3): 10 mL
  Administered 2017-07-18: 20 mL
  Administered 2017-07-18 – 2017-07-20 (×5): 10 mL

## 2017-07-16 MED ORDER — CHLORHEXIDINE GLUCONATE CLOTH 2 % EX PADS
6.0000 | MEDICATED_PAD | Freq: Every day | CUTANEOUS | Status: DC
  Administered 2017-07-17 – 2017-07-21 (×6): 6 via TOPICAL

## 2017-07-16 MED ORDER — ASPIRIN EC 81 MG PO TBEC: 81.0000 mg | DELAYED_RELEASE_TABLET | Freq: Every day | ORAL | Status: DC

## 2017-07-16 MED ORDER — ENOXAPARIN SODIUM 40 MG/0.4ML ~~LOC~~ SOLN
40.0000 mg | SUBCUTANEOUS | Status: DC
  Administered 2017-07-16 – 2017-07-17 (×2): 40 mg via SUBCUTANEOUS
  Filled 2017-07-16 (×2): qty 0.4

## 2017-07-16 MED ORDER — SODIUM CHLORIDE 0.9% FLUSH: 10.0000 mL | INTRAVENOUS | Status: DC | PRN

## 2017-07-16 MED ORDER — POTASSIUM CHLORIDE 10 MEQ/100ML IV SOLN
10.0000 meq | INTRAVENOUS | Status: AC
  Administered 2017-07-16 (×3): 10 meq via INTRAVENOUS
  Filled 2017-07-16 (×3): qty 100

## 2017-07-16 NOTE — Progress Notes (Signed)
PROGRESS NOTE    Reginald [name]  WER:[id number] DOB: 1956/08/07 DOA: 07/19/2017 PCP: Patient, [name] Per    Brief Narrative:  Reginald [name] is a 61 y.o. male with history of intellectual disability, HTN, dysphagia, muscle contractures, anxiety, PNA and polyneuropathy presented to ED today sent from [place] facility for lethargy, hypotension and decreased mobility.  Pocketing food per staff.  Had fever at the SNF.  Got Rocepin 1 gm at the SNF this am before coming here.  Also his BP's were high at the SNF.    At baseline pt is reportedly alert and gives one-word responses typically.  In ED pt is somnolent and not responding verbally.  UA shows sig pyuria and hematuria.  CXR is negative and CT head shows nothing acute.  Got 1 L IVF's in the ED.    Patient is awake, makes eye contact, smiles a bit but no verbal responses.  Doesn't follow any commands.  No family here.    No old admissions in [facility].  Only in [place] is a dental caries procedure from 04/2014 at [facility].  Started on Ceftriaxone.  Somewhat improved but had a near arrest on 10/18 requiring intubation and was placed on levophed and transferred to the ICU.  He began having seizure like activity on 10/19 and was loaded with keppra.  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  Family meeting with palliative care medicine, legal guardian and family on 10/19.  Patient was made a DNR.   Assessment & Plan:   Active Problems:   Intellectual disability   Altered mental status   Acute lower UTI   Dehydration   Contractures of both knees   Debility   Bedbound   Essential hypertension   Respiratory failure (HCC)   Palliative care encounter   Goals of care, counseling/discussion   Apnea   NSTEMI (non-ST elevated myocardial infarction) ([place])  S/p near arrest on 10/18 - intubated - troponin of 9.5- cardiology consulted - patient not a candidate for aggressive intervention - CT chest/abd/pelvis  given elevated of transaminases but CT unrevealing - Pulmonology/ Dr. Luan Pulling following - currently on levophed, fentanyl: weaning levophed as tolerated - cannot get MRI 2/2 ventilator - continues to require ventilatory support: patient not breathing above the machine - aspiration pneumonia thought to be possible cause- patient on antibiotics for aspiration pneumonia - will need dietician input to start tube feeds as patient needs nutrition (will clarify this with patient's guardian)  Altered mental status , acute on chronic  - discussed at length with patient's guardian on 10/19 patients decline since last year - per patient's guardian up until a year ago he was walking, talking, feeding himself and continent of bowel and bladder until an acute change - no explanation of acute change 1 year ago - guardian states she does not want anything that would cause discomfort or agitation to patient - family discussion with guardian and family on 10/19 patient made DNR - 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  - cannot get MRI due to ventilator - patient currently sedated on ventilator  Anemia - monitor for signs of bleeding - repeat H/H this afternoon - may need to discontinue lovenox  UTI  - urine Cx sent and showing less than 10k colony growth - CT of abd/ pelvis not showing any acute processes - rocephin stopped  Mentally retarded - per patient's guardian patient will have conversations with people he likes but has been declining and would not  engage in conversation with her this past week  Debility  - bilat LE contractures, skin in good condition  NOK  - discussed at length with patient's guardian Reginald [name]  HTN  - hold antihypertensives - levophed stopped yesterday - blood pressures have been soft   DVT prophylaxis: SCDs and lovenox Code Status: DNR Family Communication: no family bedside- attempted to call guardian but only phone number available is office  number Disposition Plan: patient prognosis guarded- not candidate for aggressive intervention but guardian does not want that anyways.  Cannot extubate or even attempt without court order   Consultants:   SLP  PCCM  Cardiology  Pulmonology  Neurology by phone  Procedures:   Central line placement  Antimicrobials:   Rocephin    Subjective: Patient sedated in ICU.  Per nursing staff he has been moving extremities occasionally  Objective: Vitals:   07/16/17 0545 07/16/17 0600 07/16/17 0700 07/16/17 0800  BP: (!) 97/52 (!) 97/51 (!) 106/55 123/61  Pulse: 66 66 64 71  Resp: _0 Temp: 99 F (37.2 C) 99 F (37.2 C) 99.1 F (37.3 C) 99.2 F (37.3 C)  TempSrc:      SpO2: 100% 100% 100% 100%  Weight:      Height:        Intake/Output Summary (Last 24 hours) at 07/16/17 0905 Last data filed at 07/16/17 0800  Gross per 24 hour  Intake          2311.13 ml  Output             1925 ml  Net           386.13 ml   Filed Weights   07/12/17 0641 07/14/17 0500 07/15/17 0400  Weight: 54.4 kg (120 lb) 57.6 kg (126 lb 15.8 oz) 57.6 kg (126 lb 15.8 oz)    Examination:  General exam:sedated  Respiratory system: intubated. Symmetrical chest rise, no wheezes, or rales.  Significant secretions in ETT but not in cannister on wall Cardiovascular system:S1 &S2 heard, RRR. II/VI systolic murmur appreciated. No JVD, rubs, gallops or clicks. No pedal edema. Gastrointestinal system:Abdomen is nondistended, soft and nontender. No organomegaly or masses felt. Normal bowel sounds heard. Central nervous system:Not able to assess as patient is sedated. Was moving legs slightly in the bed during exam but not on command Extremities: Contracted upper and lower extremities. Skin: No rashes, lesions or ulcers Psychiatry:sedated   Data Reviewed: I have personally reviewed following labs and imaging studies  CBC:  Recent Labs Lab 07/16/2017 1620 07/12/17 0421 07/13/17 2305  07/16/17 0438  WBC 6.6 8.4 8.0 8.8  NEUTROABS 5.5  --   --  7.7  HGB 11.6* 11.3* 10.7* 9.0*  HCT 36.6* 35.6* 32.9* 25.6*  MCV 89.3 89.4 88.2 81.5  PLT 234 259 162 660*   Basic Metabolic Panel:  Recent Labs Lab 07/14/17 0602 07/14/17 0912 07/14/17 1834 07/15/17 0425 07/16/17 0438  NA 130* 129* 127* 135 143  K 4.2 4.0 3.6 3.4* 3.1*  CL 88* 89* 89* 96* 105  CO2 _1 GLUCOSE 124* 119* 126* 98 73  BUN 30* 35* 39* 41* 41*  CREATININE 1.92* 2.26* 2.37* 2.15* 1.72*  CALCIUM 8.2* 8.0* 7.8* 7.6* 7.8*   GFR: Estimated Creatinine Clearance: 37.2 mL/min (A) (by C-G formula based on SCr of 1.72 mg/dL (H)). Liver Function Tests:  Recent Labs Lab 07/23/2017 1620 07/13/17 2305 07/14/17 0912  AST 36 456* 2,036*  ALT 58  783* 2,552*  ALKPHOS 167* 163* 152*  BILITOT 0.2* 0.6 0.6  PROT 6.8 6.2* 5.7*  ALBUMIN 3.3* 2.8* 2.6*   No results for input(s): LIPASE, AMYLASE in the last 168 hours. No results for input(s): AMMONIA in the last 168 hours. Coagulation Profile: No results for input(s): INR, PROTIME in the last 168 hours. Cardiac Enzymes:  Recent Labs Lab 07/14/17 0912 07/15/17 0425  TROPONINI 9.58* 5.76*   BNP (last 3 results) No results for input(s): PROBNP in the last 8760 hours. HbA1C: No results for input(s): HGBA1C in the last 72 hours. CBG:  Recent Labs Lab 07/13/17 2123  GLUCAP 121*   Lipid Profile: No results for input(s): CHOL, HDL, LDLCALC, TRIG, CHOLHDL, LDLDIRECT in the last 72 hours. Thyroid Function Tests: No results for input(s): TSH, T4TOTAL, FREET4, T3FREE, THYROIDAB in the last 72 hours. Anemia Panel: No results for input(s): VITAMINB12, FOLATE, FERRITIN, TIBC, IRON, RETICCTPCT in the last 72 hours. Sepsis Labs:  Recent Labs Lab 07/03/2017 1625  LATICACIDVEN 1.1    Recent Results (from the past 240 hour(s))  Urine culture     Status: Abnormal   Collection Time: 07/03/2017  4:36 PM  Result Value Ref Range Status   Specimen  Description URINE, CLEAN CATCH  Final   Special Requests NONE  Final   Culture (A)  Final    <10,000 COLONIES/mL INSIGNIFICANT GROWTH Performed at [facility], [address], [place], [place] [address]    Report Status 07/13/2017 FINAL  Final  MRSA PCR Screening     Status: None   Collection Time: 07/18/2017 10:05 PM  Result Value Ref Range Status   MRSA by PCR NEGATIVE NEGATIVE Final    Comment:        The GeneXpert MRSA Assay (FDA approved for NASAL specimens only), is one component of a comprehensive MRSA colonization surveillance program. It is not intended to diagnose MRSA infection nor to guide or monitor treatment for MRSA infections.   Culture, blood (Routine X 2) w Reflex to ID Panel     Status: None (Preliminary result)   Collection Time: 07/13/17 10:58 PM  Result Value Ref Range Status   Specimen Description NECK  Final   Special Requests   Final    BOTTLES DRAWN AEROBIC AND ANAEROBIC Blood Culture adequate volume   Culture NO GROWTH 2 DAYS  Final   Report Status PENDING  Incomplete  Culture, blood (Routine X 2) w Reflex to ID Panel     Status: None (Preliminary result)   Collection Time: 07/13/17 11:05 PM  Result Value Ref Range Status   Specimen Description BLOOD RIGHT ARM  Final   Special Requests   Final    BOTTLES DRAWN AEROBIC AND ANAEROBIC Blood Culture adequate volume   Culture NO GROWTH 2 DAYS  Final   Report Status PENDING  Incomplete  MRSA PCR Screening     Status: Abnormal   Collection Time: 07/14/17  7:39 AM  Result Value Ref Range Status   MRSA by PCR INVALID RESULTS, SPECIMEN SENT FOR CULTURE (A) NEGATIVE Final    Comment: [id number] [place] [id number]        The GeneXpert MRSA Assay (FDA approved for NASAL specimens only), is one component of a comprehensive MRSA colonization surveillance program. It is not intended to diagnose MRSA infection nor to guide or monitor treatment for MRSA infections.   MRSA culture      Status: None   Collection Time: 07/14/17  7:39 AM  Result Value Ref Range Status   Specimen Description NOSE  Final   Special Requests NONE  Final   Culture   Final    NO MRSA DETECTED Performed at [facility], [address], [place], [place] [address]    Report Status 07/15/2017 FINAL  Final         Radiology Studies: Ct Abdomen Pelvis Wo Contrast  Result Date: 07/14/2017 CLINICAL DATA:  Abnormal liver function tests.  Possible aspiration. EXAM: CT CHEST, ABDOMEN AND PELVIS WITHOUT CONTRAST TECHNIQUE: Multidetector CT imaging of the chest, abdomen and pelvis was performed following the standard protocol without IV contrast. COMPARISON:  None. FINDINGS: CT CHEST FINDINGS Cardiovascular: Heart is normal size. Aorta is normal caliber. Scattered aortic arch calcifications. Mediastinum/Nodes: No mediastinal, hilar, or axillary adenopathy. Endotracheal tube is in place with the tip in the mid to lower trachea. Lungs/Pleura: Dependent bibasilar opacities bilaterally, right greater than left, favor atelectasis. Trace right pleural effusion. Musculoskeletal: No acute bony abnormality. CT ABDOMEN PELVIS FINDINGS Hepatobiliary: No focal hepatic abnormality. Gallbladder unremarkable. Pancreas: No focal abnormality or ductal dilatation. Spleen: No focal abnormality.  Normal size. Adrenals/Urinary Tract: No adrenal abnormality. No focal renal abnormality. No stones or hydronephrosis. Urinary bladder is unremarkable. Stomach/Bowel: Moderate to large stool burden throughout the colon. No evidence of bowel obstruction. Appendix is normal. Vascular/Lymphatic: No evidence of aneurysm or adenopathy. Reproductive: No visible focal abnormality. Other: No free fluid or free air. Musculoskeletal: No acute bony abnormality. Degenerative facet disease in the lumbar spine. IMPRESSION: Trace right pleural effusion with dependent opacities in both lung bases, favor dependent atelectasis. Large stool burden  throughout the colon. Normal appendix. No acute findings in the chest, no acute findings in the abdomen or pelvis. Electronically Signed   By: Rolm Baptise M.D.   On: 07/14/2017 10:47   Ct Head Wo Contrast  Result Date: 07/14/2017 CLINICAL DATA:  New onset seizure. EXAM: CT HEAD WITHOUT CONTRAST TECHNIQUE: Contiguous axial images were obtained from the base of the skull through the vertex without intravenous contrast. COMPARISON:  07/22/2017 FINDINGS: Brain: New areas of hypodensity in the cerebellum bilaterally right greater than left. The appearance is most consistent with acute/subacute infarction. No associated hemorrhage. Mild local mass-effect on the fourth ventricle. Negative for hydrocephalus. Mild hypodensity in the cerebral white matter bilaterally with a chronic appearance. Vascular: Negative for hyperdense vessel Skull: Negative Sinuses/Orbits: Negative Other: None IMPRESSION: New areas of ill-defined hypodensity in the cerebellum bilaterally, right greater than left. This is most consistent with acute/subacute infarction. No hemorrhage. MRI suggested to confirm. Electronically Signed   By: Franchot Gallo M.D.   On: 07/14/2017 12:25   Ct Chest Wo Contrast  Result Date: 07/14/2017 CLINICAL DATA:  Abnormal liver function tests.  Possible aspiration. EXAM: CT CHEST, ABDOMEN AND PELVIS WITHOUT CONTRAST TECHNIQUE: Multidetector CT imaging of the chest, abdomen and pelvis was performed following the standard protocol without IV contrast. COMPARISON:  None. FINDINGS: CT CHEST FINDINGS Cardiovascular: Heart is normal size. Aorta is normal caliber. Scattered aortic arch calcifications. Mediastinum/Nodes: No mediastinal, hilar, or axillary adenopathy. Endotracheal tube is in place with the tip in the mid to lower trachea. Lungs/Pleura: Dependent bibasilar opacities bilaterally, right greater than left, favor atelectasis. Trace right pleural effusion. Musculoskeletal: No acute bony abnormality. CT ABDOMEN  PELVIS FINDINGS Hepatobiliary: No focal hepatic abnormality. Gallbladder unremarkable. Pancreas: No focal abnormality or ductal dilatation. Spleen: No focal abnormality.  Normal size. Adrenals/Urinary Tract: No adrenal abnormality. No focal renal abnormality. No  stones or hydronephrosis. Urinary bladder is unremarkable. Stomach/Bowel: Moderate to large stool burden throughout the colon. No evidence of bowel obstruction. Appendix is normal. Vascular/Lymphatic: No evidence of aneurysm or adenopathy. Reproductive: No visible focal abnormality. Other: No free fluid or free air. Musculoskeletal: No acute bony abnormality. Degenerative facet disease in the lumbar spine. IMPRESSION: Trace right pleural effusion with dependent opacities in both lung bases, favor dependent atelectasis. Large stool burden throughout the colon. Normal appendix. No acute findings in the chest, no acute findings in the abdomen or pelvis. Electronically Signed   By: Rolm Baptise M.D.   On: 07/14/2017 10:47   Dg Chest Port 1 View  Result Date: 07/15/2017 CLINICAL DATA:  Respiratory difficulty, follow-up effusion EXAM: PORTABLE CHEST 1 VIEW COMPARISON:  07/14/2017 FINDINGS: Cardiac shadow is stable. Endotracheal tube and orogastric catheter are noted within the stomach. Patient is rotated accentuating the mediastinal markings. Left jugular central line is again seen. The lungs are clear bilaterally no bony abnormality is seen. IMPRESSION: No acute abnormality noted. Tubes and lines as described. Electronically Signed   By: Inez Catalina M.D.   On: 07/15/2017 07:45   Dg Chest Port 1 View  Result Date: 07/14/2017 CLINICAL DATA:  Respiratory failure.  Ventilated patient. EXAM: PORTABLE CHEST 1 VIEW COMPARISON:  Single-view of the chest 07/13/2017. FINDINGS: ET tube and NG tube remain in place in good position. Lungs are clear. No pneumothorax or pleural effusion. Heart size is normal. IMPRESSION: Support apparatus projects in good position.   Lungs are clear. Electronically Signed   By: Inge Rise M.D.   On: 07/14/2017 09:44        Scheduled Meds: . chlorhexidine gluconate (MEDLINE KIT)  15 mL Mouth Rinse BID  . Chlorhexidine Gluconate Cloth  6 each Topical Q0600  . docusate sodium  100 mg Oral BID  . enoxaparin (LOVENOX) injection  30 mg Subcutaneous Q24H  . mouth rinse  15 mL Mouth Rinse QID  . mupirocin ointment  1 application Nasal BID  . pantoprazole (PROTONIX) IV  40 mg Intravenous Q12H   Continuous Infusions: . ceFEPime (MAXIPIME) IV Stopped (07/16/17 0701)  . fentaNYL infusion INTRAVENOUS 100 mcg/hr (07/16/17 0500)  . levETIRAcetam Stopped (07/15/17 2130)  . norepinephrine (LEVOPHED) Adult infusion Stopped (07/16/17 0500)  . vancomycin Stopped (07/15/17 1820)     LOS: 2 days    Time spent: 35 minutes critical care time    Loretha Stapler, MD [facility] Pager [phone]  If 7PM-7AM, please contact night-coverage [email or web address] Password [email or web address] 07/16/2017, 9:05 AM

## 2017-07-16 NOTE — Progress Notes (Signed)
Dr. Rinaldo RatelKadolph considering tube feeding. Attempting to get in contact with family to discuss. Dietician consult ordered.

## 2017-07-17 LAB — CBC WITH DIFFERENTIAL/PLATELET
BASOS ABS: 0 10*3/uL (ref 0.0–0.1)
BASOS ABS: 0 10*3/uL (ref 0.0–0.1)
BASOS PCT: 0 %
Basophils Relative: 0 %
EOS ABS: 0.2 10*3/uL (ref 0.0–0.7)
EOS PCT: 2 %
EOS PCT: 1 %
Eosinophils Absolute: 0.1 10*3/uL (ref 0.0–0.7)
HCT: 24.8 % — ABNORMAL LOW (ref 39.0–52.0)
HCT: 23.2 % — ABNORMAL LOW (ref 39.0–52.0)
HEMOGLOBIN: 8.5 g/dL — AB (ref 13.0–17.0)
HEMOGLOBIN: 8 g/dL — AB (ref 13.0–17.0)
LYMPHS ABS: 0.6 10*3/uL — AB (ref 0.7–4.0)
LYMPHS PCT: 7 %
Lymphocytes Relative: 9 %
Lymphs Abs: 0.8 10*3/uL (ref 0.7–4.0)
MCH: 28.5 pg (ref 26.0–34.0)
MCH: 28.8 pg (ref 26.0–34.0)
MCHC: 34.3 g/dL (ref 30.0–36.0)
MCHC: 34.5 g/dL (ref 30.0–36.0)
MCV: 83.2 fL (ref 78.0–100.0)
MCV: 83.5 fL (ref 78.0–100.0)
Monocytes Absolute: 1 10*3/uL (ref 0.1–1.0)
Monocytes Absolute: 1 10*3/uL (ref 0.1–1.0)
Monocytes Relative: 12 %
Monocytes Relative: 12 %
NEUTROS ABS: 6.5 10*3/uL (ref 1.7–7.7)
NEUTROS PCT: 77 %
NEUTROS PCT: 80 %
Neutro Abs: 6.5 10*3/uL (ref 1.7–7.7)
PLATELETS: 160 10*3/uL (ref 150–400)
Platelets: 158 10*3/uL (ref 150–400)
RBC: 2.98 MIL/uL — AB (ref 4.22–5.81)
RBC: 2.78 MIL/uL — ABNORMAL LOW (ref 4.22–5.81)
RDW: 14.5 % (ref 11.5–15.5)
RDW: 14.4 % (ref 11.5–15.5)
WBC: 8.4 10*3/uL (ref 4.0–10.5)
WBC: 8.2 10*3/uL (ref 4.0–10.5)

## 2017-07-17 LAB — BASIC METABOLIC PANEL
Anion gap: 12 (ref 5–15)
BUN: 39 mg/dL — AB (ref 6–20)
CHLORIDE: 110 mmol/L (ref 101–111)
CO2: 24 mmol/L (ref 22–32)
CREATININE: 1.61 mg/dL — AB (ref 0.61–1.24)
Calcium: 7.9 mg/dL — ABNORMAL LOW (ref 8.9–10.3)
GFR, EST AFRICAN AMERICAN: 52 mL/min — AB (ref >60)
GFR, EST NON AFRICAN AMERICAN: 45 mL/min — AB (ref >60)
Glucose, Bld: 69 mg/dL (ref 65–99)
POTASSIUM: 3.1 mmol/L — AB (ref 3.5–5.1)
SODIUM: 146 mmol/L — AB (ref 135–145)

## 2017-07-17 MED ORDER — GLYCERIN (LAXATIVE) 2.1 G RE SUPP
1.0000 | Freq: Once | RECTAL | Status: AC
  Administered 2017-07-17: 1 via RECTAL
  Filled 2017-07-17: qty 1

## 2017-07-17 MED ORDER — LEVETIRACETAM 500 MG/5ML IV SOLN
500.0000 mg | Freq: Two times a day (BID) | INTRAVENOUS | Status: DC
  Administered 2017-07-17 – 2017-07-20 (×6): 500 mg via INTRAVENOUS
  Filled 2017-07-17 (×8): qty 5

## 2017-07-17 MED ORDER — FENTANYL 2500MCG IN NS 250ML (10MCG/ML) PREMIX INFUSION
INTRAVENOUS | Status: AC
  Filled 2017-07-17: qty 250

## 2017-07-17 MED ORDER — VITAL AF 1.2 CAL PO LIQD
1000.0000 mL | ORAL | Status: DC
  Administered 2017-07-18 – 2017-07-19 (×3): 1000 mL
  Filled 2017-07-17 (×5): qty 1000

## 2017-07-17 MED ORDER — SODIUM CHLORIDE 0.9 % IV SOLN
INTRAVENOUS | Status: AC
  Administered 2017-07-17: 23:00:00 via INTRAVENOUS

## 2017-07-17 MED ORDER — POTASSIUM CHLORIDE 10 MEQ/100ML IV SOLN
10.0000 meq | INTRAVENOUS | Status: AC
  Administered 2017-07-17 (×2): 10 meq via INTRAVENOUS
  Filled 2017-07-17 (×2): qty 100

## 2017-07-17 MED ORDER — VITAL AF 1.2 CAL PO LIQD: 1000.0000 mL | ORAL | Status: DC

## 2017-07-17 MED FILL — Medication: Qty: 1 | Status: AC

## 2017-07-17 NOTE — Clinical Social Work Note (Signed)
LCSW provided status update to Shannon East at [facility].          Ranson Belluomini D, LCSW  

## 2017-07-17 NOTE — Consult Note (Addendum)
[place] A. Merlene Laughter, MD     [email or web address]          Reginald [name] is an 61 y.o. male.   ASSESSMENT/PLAN: 1. Multifactorial acute encephalopathy mostly due to acute medical illness:  Given the severe baseline static encephalopathy, pulmonary status and acute infarcts, prognosis for functional outcome is grim.  I recommend considering deceleration of care and determine wean. 2. 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   Aspirin will be started. 4. Questionable seizure activity:  He has been placed on Keppra previously because of this.  We will check an EEG.     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  The patient is a ward of the state and resided in a nursing home facility.  At functional baseline he is awake and alert and responds in 1-2  word sentences but requires total care assistance.   Patient presented to the hospital with the unresponsiveness and altered mental status on his baseline.  He had the cardiac arrest requiring intubation.  He has been intubated since then.  He apparently had a seizure-like events and has been loaded with Keppra.  He has been eating of pressures but still remains intubated and encephalopathic.  Workup has been significant for non ST segment MI and pyuria although urine culture has has shown unremarkable growth.  Remains intubated and is on low-dose fentanyl for pain control.     GENERAL:    He he is intubated.  HEENT:    There is no dysmorphic features.  Neck is supple.  ABDOMEN: soft  EXTREMITIES: No edema ;  There is severe contractures of the upper and lower extremities.   BACK: this is unremarkable  SKIN: Normal by inspection.    MENTAL STATUS:     CRANIAL NERVES: Pupils are equal, round and reactive to light;  Eyes deviated to the left but are overcome with oculocephalic reflexes; corneal reflexes are intact; visual fields -  Limited but appears to be full; upper and lower facial muscles are normal in strength and symmetric, there is no flattening of the nasolabial folds;   MOTOR:  Severe spasticity and contractions with 2/5 strength on both sides  COORDINATION:  There is no evidence of dysmetria or tremors.  REFLEXES: Deep tendon reflexes are symmetrical but diminished throughout.   SENSATION:   He responds to painful stimuli bilaterally.     Blood pressure (!) 108/51, pulse 73, temperature (!) 101 F (38.3 C), resp. rate 12, height '5\' 5"'  (1.651 m), weight 126 lb 15.8 oz (57.6 kg), SpO2 98 %.  Past Medical History:  Diagnosis Date  . Abnormal posture   . Anxiety   . Cognitive communication deficit   . Contracture of muscle   . Dysphagia   . History of pneumonia   . Hypertension   . Insomnia   . Moderate intellectual disabilities   . Polyneuropathy     History reviewed. No pertinent surgical history.  Family History  Problem Relation Age of Onset  . Family history unknown: Yes    Social History:  reports that he has never smoked. He has never used smokeless tobacco. He reports that he does not drink alcohol or use drugs.  Allergies: No Known Allergies  Medications: Prior to Admission medications   Medication Sig Start  Date End Date Taking? Authorizing Provider  acetaminophen (TYLENOL) 325 MG tablet Take 650 mg by mouth 3 (three) times daily.   Yes [provider]  acetaminophen (TYLENOL) 500 MG tablet Take 1,000 mg by mouth every 6 (six) hours as needed for mild pain or moderate pain.   Yes [provider]  ALPRAZolam (XANAX) 0.25 MG tablet Take 0.25 mg by mouth 2 (two) times daily.  06/28/2017  Yes [provider]  aspirin EC 81 MG tablet Take 81 mg by mouth daily.   Yes [provider]  baclofen (LIORESAL) 10 MG tablet Take 10 mg by mouth 3 (three) times daily. 06/18/17  Yes [provider]  busPIRone (BUSPAR) 7.5 MG tablet Take 7.5 mg by mouth 2 (two) times daily. 06/15/17  Yes [provider]  cefTRIAXone (ROCEPHIN) 1 g injection Inject 2 g into the muscle once.   Yes [provider]  chlorhexidine (PERIDEX) 0.12 % solution Use as directed 15 mLs in the mouth or throat 2 (two) times daily.   Yes [provider]  cholecalciferol (VITAMIN D) 1000 units tablet Take 1,000 Units by mouth daily.   Yes [provider]  cloNIDine (CATAPRES) 0.2 MG tablet Take 0.2 mg by mouth once.   Yes [provider]  gabapentin (NEURONTIN) 600 MG tablet Take 600 mg by mouth at bedtime.  06/05/17  Yes [provider]  HYDROcodone-acetaminophen (NORCO) 10-325 MG tablet Take 1 tablet by mouth 3 (three) times daily.  06/09/17  Yes [provider]  lisinopril (PRINIVIL,ZESTRIL) 20 MG tablet Take 20 mg by mouth daily. 06/26/17  Yes [provider]  Melatonin 3 MG TABS Take 3 mg by mouth at bedtime.   Yes [provider]  Multiple Vitamin (MULTIVITAMIN WITH MINERALS) TABS tablet Take 1 tablet by mouth daily.   Yes [provider]  OXYGEN Inhale into the lungs daily.   Yes [provider]    Scheduled Meds: . chlorhexidine gluconate (MEDLINE KIT)  15 mL Mouth Rinse BID  . Chlorhexidine Gluconate Cloth  6 each Topical Daily  . docusate sodium  100 mg Oral BID  . enoxaparin (LOVENOX) injection  40 mg Subcutaneous Q24H  . mouth rinse  15 mL Mouth Rinse QID  . mupirocin ointment  1 application Nasal BID  . pantoprazole (PROTONIX) IV  40 mg Intravenous Q12H  . sodium chloride flush  10-40 mL Intracatheter Q12H   Continuous Infusions: . sodium chloride 75 mL/hr at 07/17/17 2253  . ceFEPime (MAXIPIME) IV Stopped (07/17/17 0546)  . feeding supplement (VITAL AF 1.2 CAL) 1,000 mL (07/17/17  1515)  . fentaNYL infusion INTRAVENOUS 100 mcg/hr (07/17/17 2009)  . levETIRAcetam 500 mg (07/17/17 2230)  . norepinephrine (LEVOPHED) Adult infusion Stopped (07/16/17 0500)  . vancomycin Stopped (07/17/17 1930)   PRN Meds:.acetaminophen **OR** acetaminophen, fentaNYL, LORazepam, polyethylene glycol, polyvinyl alcohol, sodium chloride flush, sodium phosphate     Results for orders placed or performed during the hospital encounter of 07/19/2017 (from the past 48 hour(s))  Basic metabolic panel     Status: Abnormal   Collection Time: 07/16/17  4:38 AM  Result Value Ref Range   Sodium 143 135 - 145 mmol/L    Comment: DELTA CHECK NOTED   Potassium 3.1 (L) 3.5 - 5.1 mmol/L   Chloride 105 101 - 111 mmol/L   CO2 27 22 - 32 mmol/L   Glucose, Bld 73 65 - 99 mg/dL   BUN 41 (H) 6 - 20 mg/dL  Creatinine, Ser 1.72 (H) 0.61 - 1.24 mg/dL   Calcium 7.8 (L) 8.9 - 10.3 mg/dL   GFR calc non Af Amer 41 (L) >60 mL/min   GFR calc Af Amer 48 (L) >60 mL/min    Comment: (NOTE) The eGFR has been calculated using the CKD EPI equation. This calculation has not been validated in all clinical situations. eGFR's persistently <60 mL/min signify possible Chronic Kidney Disease.    Anion gap 11 5 - 15  CBC with Differential/Platelet     Status: Abnormal   Collection Time: 07/16/17  4:38 AM  Result Value Ref Range   WBC 8.8 4.0 - 10.5 K/uL   RBC 3.14 (L) 4.22 - 5.81 MIL/uL   Hemoglobin 9.0 (L) 13.0 - 17.0 g/dL   HCT 25.6 (L) 39.0 - 52.0 %   MCV 81.5 78.0 - 100.0 fL   MCH 28.7 26.0 - 34.0 pg   MCHC 35.2 30.0 - 36.0 g/dL   RDW 14.4 11.5 - 15.5 %   Platelets 148 (L) 150 - 400 K/uL   Neutrophils Relative % 88 %   Neutro Abs 7.7 1.7 - 7.7 K/uL   Lymphocytes Relative 5 %   Lymphs Abs 0.5 (L) 0.7 - 4.0 K/uL   Monocytes Relative 7 %   Monocytes Absolute 0.6 0.1 - 1.0 K/uL   Eosinophils Relative 0 %   Eosinophils Absolute 0.0 0.0 - 0.7 K/uL   Basophils Relative 0 %   Basophils Absolute 0.0 0.0 - 0.1 K/uL    CBC with Differential/Platelet     Status: Abnormal   Collection Time: 07/16/17  4:33 PM  Result Value Ref Range   WBC 7.7 4.0 - 10.5 K/uL   RBC 2.98 (L) 4.22 - 5.81 MIL/uL   Hemoglobin 8.6 (L) 13.0 - 17.0 g/dL   HCT 24.5 (L) 39.0 - 52.0 %   MCV 82.2 78.0 - 100.0 fL   MCH 28.9 26.0 - 34.0 pg   MCHC 35.1 30.0 - 36.0 g/dL   RDW 14.3 11.5 - 15.5 %   Platelets 135 (L) 150 - 400 K/uL   Neutrophils Relative % 83 %   Neutro Abs 6.4 1.7 - 7.7 K/uL   Lymphocytes Relative 7 %   Lymphs Abs 0.6 (L) 0.7 - 4.0 K/uL   Monocytes Relative 9 %   Monocytes Absolute 0.7 0.1 - 1.0 K/uL   Eosinophils Relative 1 %   Eosinophils Absolute 0.1 0.0 - 0.7 K/uL   Basophils Relative 0 %   Basophils Absolute 0.0 0.0 - 0.1 K/uL  Basic metabolic panel     Status: Abnormal   Collection Time: 07/17/17  5:32 AM  Result Value Ref Range   Sodium 146 (H) 135 - 145 mmol/L   Potassium 3.1 (L) 3.5 - 5.1 mmol/L   Chloride 110 101 - 111 mmol/L   CO2 24 22 - 32 mmol/L   Glucose, Bld 69 65 - 99 mg/dL   BUN 39 (H) 6 - 20 mg/dL   Creatinine, Ser 1.61 (H) 0.61 - 1.24 mg/dL   Calcium 7.9 (L) 8.9 - 10.3 mg/dL   GFR calc non Af Amer 45 (L) >60 mL/min   GFR calc Af Amer 52 (L) >60 mL/min    Comment: (NOTE) The eGFR has been calculated using the CKD EPI equation. This calculation has not been validated in all clinical situations. eGFR's persistently <60 mL/min signify possible Chronic Kidney Disease.    Anion gap 12 5 - 15  CBC with Differential/Platelet  Status: Abnormal   Collection Time: 07/17/17  5:32 AM  Result Value Ref Range   WBC 8.4 4.0 - 10.5 K/uL   RBC 2.98 (L) 4.22 - 5.81 MIL/uL   Hemoglobin 8.5 (L) 13.0 - 17.0 g/dL   HCT 24.8 (L) 39.0 - 52.0 %   MCV 83.2 78.0 - 100.0 fL   MCH 28.5 26.0 - 34.0 pg   MCHC 34.3 30.0 - 36.0 g/dL   RDW 14.5 11.5 - 15.5 %   Platelets 160 150 - 400 K/uL   Neutrophils Relative % 77 %   Neutro Abs 6.5 1.7 - 7.7 K/uL   Lymphocytes Relative 9 %   Lymphs Abs 0.8 0.7 - 4.0  K/uL   Monocytes Relative 12 %   Monocytes Absolute 1.0 0.1 - 1.0 K/uL   Eosinophils Relative 2 %   Eosinophils Absolute 0.2 0.0 - 0.7 K/uL   Basophils Relative 0 %   Basophils Absolute 0.0 0.0 - 0.1 K/uL  CBC with Differential/Platelet     Status: Abnormal   Collection Time: 07/17/17  5:23 PM  Result Value Ref Range   WBC 8.2 4.0 - 10.5 K/uL   RBC 2.78 (L) 4.22 - 5.81 MIL/uL   Hemoglobin 8.0 (L) 13.0 - 17.0 g/dL   HCT 23.2 (L) 39.0 - 52.0 %   MCV 83.5 78.0 - 100.0 fL   MCH 28.8 26.0 - 34.0 pg   MCHC 34.5 30.0 - 36.0 g/dL   RDW 14.4 11.5 - 15.5 %   Platelets 158 150 - 400 K/uL   Neutrophils Relative % 80 %   Neutro Abs 6.5 1.7 - 7.7 K/uL   Lymphocytes Relative 7 %   Lymphs Abs 0.6 (L) 0.7 - 4.0 K/uL   Monocytes Relative 12 %   Monocytes Absolute 1.0 0.1 - 1.0 K/uL   Eosinophils Relative 1 %   Eosinophils Absolute 0.1 0.0 - 0.7 K/uL   Basophils Relative 0 %   Basophils Absolute 0.0 0.0 - 0.1 K/uL    Studies/Results: The head CT scan is reviewed in person.  The scan shows significant hypodensity involving the cerebellum bilaterally.  The hypodensity is associated mass effect especially on the right side where it causes moderate compression of the 4th ventricle.  These findings indicate that the hypodensities are most likely acute in nature and most likely due to ischemia.    Janziel Hockett A. Merlene Laughter, M.D.  Diplomate, Tax adviser of Psychiatry and Neurology ( Neurology). 07/17/2017, 11:34 PM

## 2017-07-17 NOTE — Progress Notes (Signed)
Respiratory Care Note: While at bedside I attempted to wean patient per protocol  At 1202 and started with PS/CPAP of 5/5. There was little patient effort. The patient was increased to 20/5 PS/CPAP. The patient's effort did increase slightly with RR of 8-11 b/min and volumes of 400 to about 689 for about 30 min. The patient was placed back on full support mode due to the decrease in patient effort. I will continue to monitor patient and RN aware.

## 2017-07-17 NOTE — Progress Notes (Signed)
Subjective: He remains intubated on the ventilator. He does have DO NOT RESUSCITATE status which I think is appropriate. His acute kidney injury seems to be improving. He has been able to be reduced as far as his FiO2 and his PEEP. Blood pressure is better and he is off pressors.  Objective: Vital signs in last 24 hours: Temp:  [99 F (37.2 C)-100.4 F (38 C)] 99.4 F (37.4 C) (10/22 0746) Pulse Rate:  [67-97] 70 (10/22 0746) Resp:  [12-18] 12 (10/22 0746) BP: (103-140)/(52-66) 119/61 (10/22 0746) SpO2:  [98 %-100 %] 100 % (10/22 0746) FiO2 (%):  [35 %] 35 % (10/22 0352) Weight change:  Last BM Date: 07/23/2017  Intake/Output from previous day: 10/21 0701 - 10/22 0700 In: 910 [I.V.:210; IV Piggyback:700] Out: 2025 [Urine:1825; Emesis/NG output:200]  PHYSICAL EXAM General appearance: Intubated sedated on the ventilator Resp: rhonchi bilaterally Cardio: regular rate and rhythm, S1, S2 normal, no murmur, click, rub or gallop GI: soft, non-tender; bowel sounds normal; no masses,  no organomegaly Extremities: extremities normal, atraumatic, no cyanosis or edema Skin warm and dry  Lab Results:  Results for orders placed or performed during the hospital encounter of 06/27/2017 (from the past 48 hour(s))  Basic metabolic panel     Status: Abnormal   Collection Time: 07/16/17  4:38 AM  Result Value Ref Range   Sodium 143 135 - 145 mmol/L    Comment: DELTA CHECK NOTED   Potassium 3.1 (L) 3.5 - 5.1 mmol/L   Chloride 105 101 - 111 mmol/L   CO2 27 22 - 32 mmol/L   Glucose, Bld 73 65 - 99 mg/dL   BUN 41 (H) 6 - 20 mg/dL   Creatinine, Ser 1.72 (H) 0.61 - 1.24 mg/dL   Calcium 7.8 (L) 8.9 - 10.3 mg/dL   GFR calc non Af Amer 41 (L) >60 mL/min   GFR calc Af Amer 48 (L) >60 mL/min    Comment: (NOTE) The eGFR has been calculated using the CKD EPI equation. This calculation has not been validated in all clinical situations. eGFR's persistently <60 mL/min signify possible Chronic  Kidney Disease.    Anion gap 11 5 - 15  CBC with Differential/Platelet     Status: Abnormal   Collection Time: 07/16/17  4:38 AM  Result Value Ref Range   WBC 8.8 4.0 - 10.5 K/uL   RBC 3.14 (L) 4.22 - 5.81 MIL/uL   Hemoglobin 9.0 (L) 13.0 - 17.0 g/dL   HCT 25.6 (L) 39.0 - 52.0 %   MCV 81.5 78.0 - 100.0 fL   MCH 28.7 26.0 - 34.0 pg   MCHC 35.2 30.0 - 36.0 g/dL   RDW 14.4 11.5 - 15.5 %   Platelets 148 (L) 150 - 400 K/uL   Neutrophils Relative % 88 %   Neutro Abs 7.7 1.7 - 7.7 K/uL   Lymphocytes Relative 5 %   Lymphs Abs 0.5 (L) 0.7 - 4.0 K/uL   Monocytes Relative 7 %   Monocytes Absolute 0.6 0.1 - 1.0 K/uL   Eosinophils Relative 0 %   Eosinophils Absolute 0.0 0.0 - 0.7 K/uL   Basophils Relative 0 %   Basophils Absolute 0.0 0.0 - 0.1 K/uL  CBC with Differential/Platelet     Status: Abnormal   Collection Time: 07/16/17  4:33 PM  Result Value Ref Range   WBC 7.7 4.0 - 10.5 K/uL   RBC 2.98 (L) 4.22 - 5.81 MIL/uL   Hemoglobin 8.6 (L) 13.0 - 17.0 g/dL  HCT 24.5 (L) 39.0 - 52.0 %   MCV 82.2 78.0 - 100.0 fL   MCH 28.9 26.0 - 34.0 pg   MCHC 35.1 30.0 - 36.0 g/dL   RDW 14.3 11.5 - 15.5 %   Platelets 135 (L) 150 - 400 K/uL   Neutrophils Relative % 83 %   Neutro Abs 6.4 1.7 - 7.7 K/uL   Lymphocytes Relative 7 %   Lymphs Abs 0.6 (L) 0.7 - 4.0 K/uL   Monocytes Relative 9 %   Monocytes Absolute 0.7 0.1 - 1.0 K/uL   Eosinophils Relative 1 %   Eosinophils Absolute 0.1 0.0 - 0.7 K/uL   Basophils Relative 0 %   Basophils Absolute 0.0 0.0 - 0.1 K/uL  Basic metabolic panel     Status: Abnormal   Collection Time: 07/17/17  5:32 AM  Result Value Ref Range   Sodium 146 (H) 135 - 145 mmol/L   Potassium 3.1 (L) 3.5 - 5.1 mmol/L   Chloride 110 101 - 111 mmol/L   CO2 24 22 - 32 mmol/L   Glucose, Bld 69 65 - 99 mg/dL   BUN 39 (H) 6 - 20 mg/dL   Creatinine, Ser 1.61 (H) 0.61 - 1.24 mg/dL   Calcium 7.9 (L) 8.9 - 10.3 mg/dL   GFR calc non Af Amer 45 (L) >60 mL/min   GFR calc Af Amer 52 (L)  >60 mL/min    Comment: (NOTE) The eGFR has been calculated using the CKD EPI equation. This calculation has not been validated in all clinical situations. eGFR's persistently <60 mL/min signify possible Chronic Kidney Disease.    Anion gap 12 5 - 15  CBC with Differential/Platelet     Status: Abnormal   Collection Time: 07/17/17  5:32 AM  Result Value Ref Range   WBC 8.4 4.0 - 10.5 K/uL   RBC 2.98 (L) 4.22 - 5.81 MIL/uL   Hemoglobin 8.5 (L) 13.0 - 17.0 g/dL   HCT 24.8 (L) 39.0 - 52.0 %   MCV 83.2 78.0 - 100.0 fL   MCH 28.5 26.0 - 34.0 pg   MCHC 34.3 30.0 - 36.0 g/dL   RDW 14.5 11.5 - 15.5 %   Platelets 160 150 - 400 K/uL   Neutrophils Relative % 77 %   Neutro Abs 6.5 1.7 - 7.7 K/uL   Lymphocytes Relative 9 %   Lymphs Abs 0.8 0.7 - 4.0 K/uL   Monocytes Relative 12 %   Monocytes Absolute 1.0 0.1 - 1.0 K/uL   Eosinophils Relative 2 %   Eosinophils Absolute 0.2 0.0 - 0.7 K/uL   Basophils Relative 0 %   Basophils Absolute 0.0 0.0 - 0.1 K/uL    ABGS  Recent Labs  07/15/17 0500  PHART 7.499*  PO2ART 179*  HCO3 29.7*   CULTURES Recent Results (from the past 240 hour(s))  Urine culture     Status: Abnormal   Collection Time: 07/10/2017  4:36 PM  Result Value Ref Range Status   Specimen Description URINE, CLEAN CATCH  Final   Special Requests NONE  Final   Culture (A)  Final    <10,000 COLONIES/mL INSIGNIFICANT GROWTH Performed at [facility], [address], [place], [place] [address]    Report Status 07/13/2017 FINAL  Final  MRSA PCR Screening     Status: None   Collection Time: 07/16/2017 10:05 PM  Result Value Ref Range Status   MRSA by PCR NEGATIVE NEGATIVE Final    Comment:  The GeneXpert MRSA Assay (FDA approved for NASAL specimens only), is one component of a comprehensive MRSA colonization surveillance program. It is not intended to diagnose MRSA infection nor to guide or monitor treatment for MRSA infections.   Culture, blood (Routine X 2)  w Reflex to ID Panel     Status: None (Preliminary result)   Collection Time: 07/13/17 10:58 PM  Result Value Ref Range Status   Specimen Description NECK  Final   Special Requests   Final    BOTTLES DRAWN AEROBIC AND ANAEROBIC Blood Culture adequate volume   Culture NO GROWTH 4 DAYS  Final   Report Status PENDING  Incomplete  Culture, blood (Routine X 2) w Reflex to ID Panel     Status: None (Preliminary result)   Collection Time: 07/13/17 11:05 PM  Result Value Ref Range Status   Specimen Description BLOOD RIGHT ARM  Final   Special Requests   Final    BOTTLES DRAWN AEROBIC AND ANAEROBIC Blood Culture adequate volume   Culture NO GROWTH 4 DAYS  Final   Report Status PENDING  Incomplete  MRSA PCR Screening     Status: Abnormal   Collection Time: 07/14/17  7:39 AM  Result Value Ref Range Status   MRSA by PCR INVALID RESULTS, SPECIMEN SENT FOR CULTURE (A) NEGATIVE Final    Comment: HYLTON L. AT [address] ON [id number]        The GeneXpert MRSA Assay (FDA approved for NASAL specimens only), is one component of a comprehensive MRSA colonization surveillance program. It is not intended to diagnose MRSA infection nor to guide or monitor treatment for MRSA infections.   MRSA culture     Status: None   Collection Time: 07/14/17  7:39 AM  Result Value Ref Range Status   Specimen Description NOSE  Final   Special Requests NONE  Final   Culture   Final    NO MRSA DETECTED Performed at [facility], [address], [place], [place] [address]    Report Status 07/15/2017 FINAL  Final   Studies/Results: No results found.  Medications:  Prior to Admission:  Prescriptions Prior to Admission  Medication Sig Dispense Refill Last Dose  . acetaminophen (TYLENOL) 325 MG tablet Take 650 mg by mouth 3 (three) times daily.   07/01/2017 at Unknown time  . acetaminophen (TYLENOL) 500 MG tablet Take 1,000 mg by mouth every 6 (six) hours as needed for mild pain or moderate pain.    unknown  . ALPRAZolam (XANAX) 0.25 MG tablet Take 0.25 mg by mouth 2 (two) times daily.    07/17/2017 at Unknown time  . aspirin EC 81 MG tablet Take 81 mg by mouth daily.   07/02/2017 at Unknown time  . baclofen (LIORESAL) 10 MG tablet Take 10 mg by mouth 3 (three) times daily.   07/18/2017 at Unknown time  . busPIRone (BUSPAR) 7.5 MG tablet Take 7.5 mg by mouth 2 (two) times daily.   07/16/2017 at Unknown time  . cefTRIAXone (ROCEPHIN) 1 g injection Inject 2 g into the muscle once.   07/04/2017 at Unknown time  . chlorhexidine (PERIDEX) 0.12 % solution Use as directed 15 mLs in the mouth or throat 2 (two) times daily.   06/28/2017 at Unknown time  . cholecalciferol (VITAMIN D) 1000 units tablet Take 1,000 Units by mouth daily.   06/27/2017 at Unknown time  . cloNIDine (CATAPRES) 0.2 MG tablet Take 0.2 mg by mouth once.   07/01/2017 at  Unknown time  . gabapentin (NEURONTIN) 600 MG tablet Take 600 mg by mouth at bedtime.    07/10/2017 at Unknown time  . HYDROcodone-acetaminophen (NORCO) 10-325 MG tablet Take 1 tablet by mouth 3 (three) times daily.    07/22/2017 at Unknown time  . lisinopril (PRINIVIL,ZESTRIL) 20 MG tablet Take 20 mg by mouth daily.   07/08/2017 at Unknown time  . Melatonin 3 MG TABS Take 3 mg by mouth at bedtime.   07/10/2017 at Unknown time  . Multiple Vitamin (MULTIVITAMIN WITH MINERALS) TABS tablet Take 1 tablet by mouth daily.   07/05/2017 at Unknown time  . OXYGEN Inhale into the lungs daily.   07/08/2017 at Unknown time   Scheduled: . chlorhexidine gluconate (MEDLINE KIT)  15 mL Mouth Rinse BID  . Chlorhexidine Gluconate Cloth  6 each Topical Daily  . docusate sodium  100 mg Oral BID  . enoxaparin (LOVENOX) injection  40 mg Subcutaneous Q24H  . mouth rinse  15 mL Mouth Rinse QID  . mupirocin ointment  1 application Nasal BID  . pantoprazole (PROTONIX) IV  40 mg Intravenous Q12H  . sodium chloride flush  10-40 mL Intracatheter Q12H   Continuous: . ceFEPime (MAXIPIME)  IV Stopped (07/17/17 0546)  . fentaNYL infusion INTRAVENOUS 100 mcg/hr (07/16/17 1447)  . levETIRAcetam Stopped (07/16/17 2200)  . norepinephrine (LEVOPHED) Adult infusion Stopped (07/16/17 0500)  . vancomycin Stopped (07/16/17 1915)   YQM:[id number] **OR** acetaminophen, fentaNYL, LORazepam, polyethylene glycol, polyvinyl alcohol, sodium chloride flush, sodium phosphate  Assesment: He was admitted with presumed UTI. He suffered near cardiac arrest ended up intubated with multisystem failure. He is a ward of the state. He has had multiple infarctions. He has had elevated LFTs. He has had acute kidney injury which is improving. Since he has a ward of the state there is a question about taking him off the ventilator without a court order. However I think if he can come off of the ventilator under normal circumstances such as he has improved we should be able to go ahead and get him off just like we would any other patient. Active Problems:   Intellectual disability   Altered mental status   Acute lower UTI   Dehydration   Contractures of both knees   Debility   Bedbound   Essential hypertension   Respiratory failure (HCC)   Palliative care encounter   Goals of care, counseling/discussion   Apnea   NSTEMI (non-ST elevated myocardial infarction) [facility])    Plan: Try to wean today. I don't know if he's ready to come off or not    LOS: 3 days   Sneijder Bernards L 07/17/2017, 8:27 AM

## 2017-07-17 NOTE — Progress Notes (Signed)
Initial Nutrition Assessment  DOCUMENTATION CODES:      INTERVENTION:  Initiate Vital 1.2 @ 50 ml/hr (1200 ml every 24 hr) -per PEPuP protocol  Tube feeding regimen provides 1440 kcal 90 grams of protein, 973 ml of H2O.    NO- BM since 10/16??   NUTRITION DIAGNOSIS:   Inadequate oral intake related to inability to eat as evidenced by NPO status.   GOAL:  provide needs based on ASPEN/SCCM guidelines   MONITOR:   Vent status, TF tolerance, Labs, Weight trends, I & O's  REASON FOR ASSESSMENT:   Consult Enteral/tube feeding initiation and management  ASSESSMENT:   the patient is a 61 yo male with intellectual disabilitites and communication deficit. He is intubated and a ward of the state. He has limited wt hx available. Intubated since Friday and tube feedings are being started today.  Respiratory is working with him for weaning trial but is needing cueing to breathe per respiratory therapist. NFPE deferred at this time.   Recent Labs Lab 07/15/17 0425 07/16/17 0438 07/17/17 0532  NA 135 143 146*  K 3.4* 3.1* 3.1*  CL 96* 105 110  CO2 28 27 24   BUN 41* 41* 39*  CREATININE 2.15* 1.72* 1.61*  CALCIUM 7.6* 7.8* 7.9*  GLUCOSE 98 73 69   Labs: sodium 146, potassium 3.1, BUN 39 and Cr 1.61    Diet Order:  Diet NPO time specified  Skin:   (heels dry flaky)  Last BM:  10/16   Height:   Ht Readings from Last 1 Encounters:  07/12/17 5\' 5"  (1.651 m)    Weight:   Wt Readings from Last 1 Encounters:  07/15/17 126 lb 15.8 oz (57.6 kg)    Ideal Body Weight:  62 kg  BMI:  Body mass index is 21.13 kg/m.  Estimated Nutritional Needs:   Kcal:  1521   Protein:  69-80 gr  Fluid:  > 1500 ml daily  EDUCATION NEEDS:   Education needs no appropriate at this time Royann ShiversLynn Tyvon Eggenberger MS,RD,CSG,LDN Office: [phone] Pager: [phone]

## 2017-07-17 NOTE — Progress Notes (Signed)
PROGRESS NOTE    Reginald [name]  ENI:[id number] DOB: [birth year] DOA: 06/28/2017 PCP: Patient, [name] Per    Brief Narrative:  Reginald [name] is a 61 y.o. male with history of intellectual disability, HTN, dysphagia, muscle contractures, anxiety, PNA and polyneuropathy presented to ED today sent from [facility] facility for lethargy, hypotension and decreased mobility.  Pocketing food per staff.  Had fever at the SNF.  Got Rocepin 1 gm at the SNF this am before coming here.  Also his BP's were high at the SNF.    At baseline pt is reportedly alert and gives one-word responses typically.  In ED pt is somnolent and not responding verbally.  UA shows sig pyuria and hematuria.  CXR is negative and CT head shows nothing acute.  Got 1 L IVF's in the ED.    Patient is awake, makes eye contact, smiles a bit but no verbal responses.  Doesn't follow any commands.  No family here.    No old admissions in EPIC.  Only in [place] is a dental caries procedure from 04/2014 at [facility].  Started on Ceftriaxone.  Somewhat improved but had a near arrest on 10/18 requiring intubation and was placed on levophed and transferred to the ICU. Troponin was found to be 9.5 and so cardiology consulted- recommended goals of care discussion and possibly starting heparin however, he began having seizure like activity on 10/19 and was loaded with keppra.  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  ETT starting showing some blood on 10/19 so heparin was not started.  Family meeting with palliative care medicine, legal guardian and family on 10/19.  Patient was made a DNR.  Neurology consulted for new seizures. Pulmonology consulted for ventilator management.       Assessment & Plan:   Active Problems:   Intellectual disability   Altered mental status   Acute lower UTI   Dehydration   Contractures of both knees   Debility   Bedbound   Essential hypertension   Respiratory failure  (HCC)   Palliative care encounter   Goals of care, counseling/discussion   Apnea   NSTEMI (non-ST elevated myocardial infarction) ([place])  S/p near arrest on 10/18 - intubated - troponin of 9.5- cardiology recommended heparin for 48-72 hours but not initiated 2/2 seizures initially and then blood in ETT tube - patient not a candidate for aggressive intervention - CT chest/abd/pelvis given elevated of transaminases but CT unrevealing - Pulmonology/ Dr. Luan Pulling following - off levophed now - cannot get MRI 2/2 ventilator - continues to require ventilatory support: patient not breathing above the machine - nutrition consulted for tube feeding recommendation  Altered mental status , acute on chronic  - discussed at length with patient's guardian on 10/19 patients decline since last year - per patient's guardian up until a year ago he was walking, talking, feeding himself and continent of bowel and bladder until an acute change - no explanation of acute change 1 year ago - guardian states she does not want anything that would cause discomfort or agitation to patient - family discussion with guardian and family on 10/19 patient made DNR - 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  - cannot get MRI due to ventilator - neurology consulted 2/2 seizure like activity - currently sedated but sedation weaning  Seizure - new onsent - loaded with keppra - continue Keppra BID - clonazepam if patient develops more seizures - hold ativan given elevated LFT's  Anemia -  monitor for signs of bleeding - H/H stable - no further signs of bleeding in ETT tube  UTI  - urine Cx sent and showing less than 10k colony growth - CT of abd/ pelvis not showing any acute processes - rocephin stopped  Mentally retarded - per patient's guardian patient will have conversations with people he likes but has been declining and would not engage in conversation with her this past week  Debility  - bilat  LE contractures, skin in good condition  HTN  - hold antihypertensives - levophed off - blood pressures have been soft   DVT prophylaxis: SCDs and lovenox Code Status: DNR Family Communication: no family bedside- called patient's guardian Betsey Amen- will start tube feeds.   Disposition Plan: patient prognosis guarded- not candidate for aggressive intervention but guardian does not want that anyways.  Cannot extubate or even attempt without court order.  Will start tube feeds.  Attempting to wean ventilator.     Consultants:   SLP  PCCM  Cardiology  Pulmonology  Neurology by phone  Procedures:   Central line placement  Antimicrobials:   Rocephin    Subjective: Patient not responsive on ventilator.  Objective: Vitals:   07/17/17 0900 07/17/17 1000 07/17/17 1100 07/17/17 1157  BP: 121/63 113/61 123/63   Pulse: 72 71 73 90  Resp: _0 Temp: 99.5 F (37.5 C) 99.7 F (37.6 C) 99.8 F (37.7 C)   TempSrc:      SpO2: 99% 99% 99% 99%  Weight:      Height:        Intake/Output Summary (Last 24 hours) at 07/17/17 1218 Last data filed at 07/17/17 0600  Gross per 24 hour  Intake              560 ml  Output             1300 ml  Net             -740 ml   Filed Weights   07/12/17 0641 07/14/17 0500 07/15/17 0400  Weight: 54.4 kg (120 lb) 57.6 kg (126 lb 15.8 oz) 57.6 kg (126 lb 15.8 oz)    Examination:  General exam:sedated  Respiratory system: intubated. Symmetrical chest rise, no wheezes, or rales. minimal secretions in ETT Cardiovascular system:S1 &S2 heard, RRR. II/VI systolic murmur appreciated. No JVD, rubs, gallops or clicks. No pedal edema. Gastrointestinal system:Abdomen is nondistended, soft and nontender. No organomegaly or masses felt. soft bowel sounds heard. Central nervous system:Not able to assess as patient is sedated. Was moving legs slightly in the bed during exam and fluttering eyelids but not on command Extremities:  Contracted upper and lower extremities. Skin: No rashes, lesions or ulcers Psychiatry:sedated   Data Reviewed: I have personally reviewed following labs and imaging studies  CBC:  Recent Labs Lab 07/16/2017 1620 07/12/17 0421 07/13/17 2305 07/16/17 0438 07/16/17 1633 07/17/17 0532  WBC 6.6 8.4 8.0 8.8 7.7 8.4  NEUTROABS 5.5  --   --  7.7 6.4 6.5  HGB 11.6* 11.3* 10.7* 9.0* 8.6* 8.5*  HCT 36.6* 35.6* 32.9* 25.6* 24.5* 24.8*  MCV 89.3 89.4 88.2 81.5 82.2 83.2  PLT 234 259 162 148* 135* 791   Basic Metabolic Panel:  Recent Labs Lab 07/14/17 0912 07/14/17 1834 07/15/17 0425 07/16/17 0438 07/17/17 0532  NA 129* 127* 135 143 146*  K 4.0 3.6 3.4* 3.1* 3.1*  CL 89* 89* 96* 105 110  CO2 _1 24  GLUCOSE 119* 126* 98 73 69  BUN 35* 39* 41* 41* 39*  CREATININE 2.26* 2.37* 2.15* 1.72* 1.61*  CALCIUM 8.0* 7.8* 7.6* 7.8* 7.9*   GFR: Estimated Creatinine Clearance: 39.8 mL/min (A) (by C-G formula based on SCr of 1.61 mg/dL (H)). Liver Function Tests:  Recent Labs Lab 06/27/2017 1620 07/13/17 2305 07/14/17 0912  AST 36 456* 2,036*  ALT 58 783* 2,552*  ALKPHOS 167* 163* 152*  BILITOT 0.2* 0.6 0.6  PROT 6.8 6.2* 5.7*  ALBUMIN 3.3* 2.8* 2.6*   No results for input(s): LIPASE, AMYLASE in the last 168 hours. No results for input(s): AMMONIA in the last 168 hours. Coagulation Profile: No results for input(s): INR, PROTIME in the last 168 hours. Cardiac Enzymes:  Recent Labs Lab 07/14/17 0912 07/15/17 0425  TROPONINI 9.58* 5.76*   BNP (last 3 results) No results for input(s): PROBNP in the last 8760 hours. HbA1C: No results for input(s): HGBA1C in the last 72 hours. CBG:  Recent Labs Lab 07/13/17 2123  GLUCAP 121*   Lipid Profile: No results for input(s): CHOL, HDL, LDLCALC, TRIG, CHOLHDL, LDLDIRECT in the last 72 hours. Thyroid Function Tests: No results for input(s): TSH, T4TOTAL, FREET4, T3FREE, THYROIDAB in the last 72 hours. Anemia Panel: No  results for input(s): VITAMINB12, FOLATE, FERRITIN, TIBC, IRON, RETICCTPCT in the last 72 hours. Sepsis Labs:  Recent Labs Lab 06/30/2017 1625  LATICACIDVEN 1.1    Recent Results (from the past 240 hour(s))  Urine culture     Status: Abnormal   Collection Time: 07/20/2017  4:36 PM  Result Value Ref Range Status   Specimen Description URINE, CLEAN CATCH  Final   Special Requests NONE  Final   Culture (A)  Final    <10,000 COLONIES/mL INSIGNIFICANT GROWTH Performed at [facility], [address], [place], [place] [address]    Report Status 07/13/2017 FINAL  Final  MRSA PCR Screening     Status: None   Collection Time: 06/27/2017 10:05 PM  Result Value Ref Range Status   MRSA by PCR NEGATIVE NEGATIVE Final    Comment:        The GeneXpert MRSA Assay (FDA approved for NASAL specimens only), is one component of a comprehensive MRSA colonization surveillance program. It is not intended to diagnose MRSA infection nor to guide or monitor treatment for MRSA infections.   Culture, blood (Routine X 2) w Reflex to ID Panel     Status: None (Preliminary result)   Collection Time: 07/13/17 10:58 PM  Result Value Ref Range Status   Specimen Description NECK  Final   Special Requests   Final    BOTTLES DRAWN AEROBIC AND ANAEROBIC Blood Culture adequate volume   Culture NO GROWTH 4 DAYS  Final   Report Status PENDING  Incomplete  Culture, blood (Routine X 2) w Reflex to ID Panel     Status: None (Preliminary result)   Collection Time: 07/13/17 11:05 PM  Result Value Ref Range Status   Specimen Description BLOOD RIGHT ARM  Final   Special Requests   Final    BOTTLES DRAWN AEROBIC AND ANAEROBIC Blood Culture adequate volume   Culture NO GROWTH 4 DAYS  Final   Report Status PENDING  Incomplete  MRSA PCR Screening     Status: Abnormal   Collection Time: 07/14/17  7:39 AM  Result Value Ref Range Status   MRSA by PCR INVALID RESULTS, SPECIMEN SENT FOR CULTURE (A) NEGATIVE Final     Comment: HYLTON L.  AT [place]        The GeneXpert MRSA Assay (FDA approved for NASAL specimens only), is one component of a comprehensive MRSA colonization surveillance program. It is not intended to diagnose MRSA infection nor to guide or monitor treatment for MRSA infections.   MRSA culture     Status: None   Collection Time: 07/14/17  7:39 AM  Result Value Ref Range Status   Specimen Description NOSE  Final   Special Requests NONE  Final   Culture   Final    NO MRSA DETECTED Performed at [facility], [address], [place],  [address]    Report Status 07/15/2017 FINAL  Final         Radiology Studies: No results found.      Scheduled Meds: . chlorhexidine gluconate (MEDLINE KIT)  15 mL Mouth Rinse BID  . Chlorhexidine Gluconate Cloth  6 each Topical Daily  . docusate sodium  100 mg Oral BID  . enoxaparin (LOVENOX) injection  40 mg Subcutaneous Q24H  . mouth rinse  15 mL Mouth Rinse QID  . mupirocin ointment  1 application Nasal BID  . pantoprazole (PROTONIX) IV  40 mg Intravenous Q12H  . sodium chloride flush  10-40 mL Intracatheter Q12H   Continuous Infusions: . ceFEPime (MAXIPIME) IV Stopped (07/17/17 0546)  . fentaNYL infusion INTRAVENOUS Stopped (07/17/17 1134)  . levETIRAcetam    . norepinephrine (LEVOPHED) Adult infusion Stopped (07/16/17 0500)  . vancomycin Stopped (07/16/17 1915)     LOS: 3 days    Time spent: 35 minutes critical care time    Loretha Stapler, MD [facility] Pager [phone]  If 7PM-7AM, please contact night-coverage [email or web address] Password [email or web address] 07/17/2017, 12:18 PM

## 2017-07-18 ENCOUNTER — Inpatient Hospital Stay (HOSPITAL_COMMUNITY)
Admit: 2017-07-18 | Discharge: 2017-07-18 | Disposition: A | Discharge status: Home / Self Care | Payer: Medicare Other | Attending: Neurology | Admitting: Neurology

## 2017-07-18 DIAGNOSIS — R569 Unspecified convulsions: Secondary | ICD-10-CM

## 2017-07-18 LAB — CBC WITH DIFFERENTIAL/PLATELET
BASOS ABS: 0 10*3/uL (ref 0.0–0.1)
BASOS PCT: 0 %
Basophils Absolute: 0 10*3/uL (ref 0.0–0.1)
Basophils Relative: 0 %
EOS ABS: 0.1 10*3/uL (ref 0.0–0.7)
EOS ABS: 0.1 10*3/uL (ref 0.0–0.7)
Eosinophils Relative: 1 %
Eosinophils Relative: 2 %
HCT: 22.7 % — ABNORMAL LOW (ref 39.0–52.0)
HCT: 23.3 % — ABNORMAL LOW (ref 39.0–52.0)
HEMOGLOBIN: 7.6 g/dL — AB (ref 13.0–17.0)
HEMOGLOBIN: 7.8 g/dL — AB (ref 13.0–17.0)
LYMPHS ABS: 0.6 10*3/uL — AB (ref 0.7–4.0)
LYMPHS PCT: 7 %
Lymphocytes Relative: 8 %
Lymphs Abs: 0.7 10*3/uL (ref 0.7–4.0)
MCH: 28.4 pg (ref 26.0–34.0)
MCH: 28.7 pg (ref 26.0–34.0)
MCHC: 33.5 g/dL (ref 30.0–36.0)
MCHC: 33.5 g/dL (ref 30.0–36.0)
MCV: 84.7 fL (ref 78.0–100.0)
MCV: 85.7 fL (ref 78.0–100.0)
Monocytes Absolute: 1.2 10*3/uL — ABNORMAL HIGH (ref 0.1–1.0)
Monocytes Absolute: 1.3 10*3/uL — ABNORMAL HIGH (ref 0.1–1.0)
Monocytes Relative: 14 %
Monocytes Relative: 15 %
NEUTROS PCT: 77 %
NEUTROS PCT: 76 %
Neutro Abs: 6.4 10*3/uL (ref 1.7–7.7)
Neutro Abs: 6.6 10*3/uL (ref 1.7–7.7)
PLATELETS: 177 10*3/uL (ref 150–400)
Platelets: 176 10*3/uL (ref 150–400)
RBC: 2.68 MIL/uL — AB (ref 4.22–5.81)
RBC: 2.72 MIL/uL — AB (ref 4.22–5.81)
RDW: 14.6 % (ref 11.5–15.5)
RDW: 14.6 % (ref 11.5–15.5)
WBC: 8.4 10*3/uL (ref 4.0–10.5)
WBC: 8.6 10*3/uL (ref 4.0–10.5)

## 2017-07-18 LAB — CULTURE, BLOOD (ROUTINE X 2)
CULTURE: NO GROWTH
CULTURE: NO GROWTH
SPECIAL REQUESTS: ADEQUATE
Special Requests: ADEQUATE

## 2017-07-18 LAB — CREATININE, SERUM
CREATININE: 1.58 mg/dL — AB (ref 0.61–1.24)
GFR, EST AFRICAN AMERICAN: 53 mL/min — AB (ref >60)
GFR, EST NON AFRICAN AMERICAN: 46 mL/min — AB (ref >60)

## 2017-07-18 MED ORDER — ASPIRIN 325 MG PO TABS
325.0000 mg | ORAL_TABLET | Freq: Every day | ORAL | Status: DC
  Administered 2017-07-18 – 2017-07-20 (×3): 325 mg via ORAL
  Filled 2017-07-18 (×3): qty 1

## 2017-07-18 MED ORDER — BISACODYL 10 MG RE SUPP
10.0000 mg | Freq: Every day | RECTAL | Status: DC | PRN
  Administered 2017-07-18 – 2017-07-19 (×2): 10 mg via RECTAL
  Filled 2017-07-18 (×2): qty 1

## 2017-07-18 NOTE — Progress Notes (Signed)
Summary of hospitalization:  Reginald SanteeLonnie Breezeis a 61 y.o.malewith history of intellectual disability, HTN, dysphagia, muscle contractures, anxiety, PNA and polyneuropathy [facility] for [facility], hypotension and decreased mobility. Pocketing food per staff. Had fever at the SNF. Got Rocepin 1 gm at the SNF this am before coming here. Also his BP's were high at the SNF.   At baseline pt is reportedly alert and gives one-word responses typically. In ED pt is somnolent and not responding verbally. UA shows sig pyuria and hematuria. CXR is negative and CT head shows nothing acute. Got 1 L IVF's in the ED.   Initially patient was awake, making eye contact, smiling a bit but no verbal responses. Doesn't follow any commands. No family present.  No old admissions in EPIC. Only in Care Everywhere is a dental caries procedure from 04/[facility]. Started on Ceftriaxone for urinary tract infection.  He was also found to be hypernatremic for which IVF were administered.  On day two of admission his sodium had decreased to normal value but patient was lethargic and so he was held for another night for monitoring.  On night of 10/18 patient had a near arrest on 10/18 requiring intubation and was placed on levophed and transferred to the ICU. Troponin was found to be 9.5 (likely an NSTEMI) and so cardiology consulted- recommended goals of care discussion and possibly starting heparin however, he began having seizure like activity on 10/19 and was loaded with keppra.  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  ETT starting showing some blood on 10/19 so heparin was not started.  Family meeting with palliative care medicine, legal guardian and family on 10/19.  Patient was made a DNR.  Neurology consulted for new seizures. Pulmonology consulted for ventilator management.  Cardiology recommendations after assessment for NSTEMI "Patient with  complex multisystem organ failure following recent respiratory arrest. Troponin I level is consistent with NSTEMI, but the patient is not a candidate for invasive cardiac evaluation such as cardiac catheterization at this time. Would recommend supportive measures. He is on Lovenox. Add aspirin. Heparin for 48-72 hours would be a consideration, although may need to reimage his head in light of seizure-like activity first. Echocardiogram could be obtained to objectively assess LVEF as a means of guiding medical therapy. Frankly, his prognosis is quite poor based on current status and likelihood of in-hospital mortality is high. Consider discussions regarding Palliative Care and other end-of-life concerns."  Pulmonology recommendations: " He was admitted with what appeared to be UTI had near respiratory arrest requiring intubation and mechanical ventilation. He has poor respiratory effort. He had a non-STEMI during his near respiratory arrest. He may have aspirated. He has hypertension at baseline. At baseline he was bedbound with contractures and intellectual disability possibly cerebral palsy per neurology. He has had bilateral large vessel cerebellar infarctions. He has had what may be seizures. He is a ward of the state. I don't think are going to be able to wean him traditionally and apparently it takes a court order to take him off the ventilator under any other circumstances Plan: His prognosis is poor. Agree with DO NOT RESUSCITATE status."  Neurology assessment: 1. Multifactorial acute encephalopathy mostly due to acute medical illness:  Given the severe baseline static encephalopathy, pulmonary status and acute infarcts, prognosis for functional outcome is grim.  I recommend considering deceleration of care and determine wean. 2. 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   Aspirin will be started. 4. Questionable seizure activity:  He has been placed on Keppra previously because of this.  We will check an EEG.   His hemoglobin and hematocrit continue to decline which could be due to malnutrition or bleeding.  Patient unable to wean off ventilator on 10/22 secondary to poor respiratory effort (patient did not breath above the machine as it was weaned).  He is unresponsive and sedated currently.  Current prognosis is poor given the severity multiorgan assault during hospitalization.

## 2017-07-18 NOTE — Procedures (Signed)
[facility]  A. 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  MEDICATIONS: Scheduled Meds: . aspirin  325 mg Oral Daily  . chlorhexidine gluconate (MEDLINE KIT)  15 mL Mouth Rinse BID  . Chlorhexidine Gluconate Cloth  6 each Topical Daily  . docusate sodium  100 mg Oral BID  . mouth rinse  15 mL Mouth Rinse QID  . mupirocin ointment  1 application Nasal BID  . pantoprazole (PROTONIX) IV  40 mg Intravenous Q12H  . sodium chloride flush  10-40 mL Intracatheter Q12H   Continuous Infusions: . ceFEPime (MAXIPIME) IV Stopped (07/18/17 0616)  . feeding supplement (VITAL AF 1.2 CAL) 1,000 mL (07/18/17 1712)  . fentaNYL infusion INTRAVENOUS 100 mcg/hr (07/18/17 2119)  . levETIRAcetam 500 mg (07/18/17 2123)  . norepinephrine (LEVOPHED) Adult infusion Stopped (07/16/17 0500)   PRN Meds:.acetaminophen **OR** acetaminophen, bisacodyl, fentaNYL, LORazepam, polyethylene glycol, polyvinyl alcohol, sodium chloride flush, sodium phosphate  Prior to Admission medications   Medication Sig Start Date End Date Taking? Authorizing Provider  acetaminophen (TYLENOL) 325 MG tablet Take 650 mg by mouth 3 (three) times daily.   Yes [provider]  acetaminophen (TYLENOL) 500 MG tablet Take 1,000 mg by mouth every 6 (six) hours as needed for mild pain or moderate pain.   Yes [provider]  ALPRAZolam (XANAX) 0.25 MG tablet Take 0.25 mg by mouth 2 (two) times daily.  07/03/2017  Yes [provider]  aspirin EC 81 MG tablet Take 81 mg by mouth daily.   Yes [provider]  baclofen (LIORESAL) 10 MG tablet Take 10 mg by mouth 3 (three) times daily. 06/18/17  Yes [provider]  busPIRone (BUSPAR) 7.5 MG tablet Take 7.5 mg by mouth 2 (two) times daily. 06/15/17  Yes [provider]  cefTRIAXone (ROCEPHIN) 1 g injection Inject 2 g into  the muscle once.   Yes [provider]  chlorhexidine (PERIDEX) 0.12 % solution Use as directed 15 mLs in the mouth or throat 2 (two) times daily.   Yes [provider]  cholecalciferol (VITAMIN D) 1000 units tablet Take 1,000 Units by mouth daily.   Yes [provider]  cloNIDine (CATAPRES) 0.2 MG tablet Take 0.2 mg by mouth once.   Yes [provider]  gabapentin (NEURONTIN) 600 MG tablet Take 600 mg by mouth at bedtime.  06/05/17  Yes [provider]  HYDROcodone-acetaminophen (NORCO) 10-325 MG tablet Take 1 tablet by mouth 3 (three) times daily.  06/09/17  Yes [provider]  lisinopril (PRINIVIL,ZESTRIL) 20 MG tablet Take 20 mg by mouth daily. 06/26/17  Yes [provider]  Melatonin 3 MG TABS Take 3 mg by mouth at bedtime.   Yes [provider]  Multiple Vitamin (MULTIVITAMIN WITH MINERALS) TABS tablet Take 1 tablet by mouth daily.   Yes [provider]  OXYGEN Inhale into the lungs daily.   Yes [provider]      ANALYSIS: A 16 channel recording using standard 10 20 measurements is conducted for 24 minutes.   The background activity gets as high as seven hertz.  However, there is frequent overlying four hertz delta activity seen throughout the recording.  Sleep architectures is also observed.  There is rudimentary spindles and K complexes observed.  Vertex sharp waves are also observed.  Photic stimulation and hyperventilation are not conducted.    There is no focal or lateralized slowing.  There is no epileptiform activity is observed.   IMPRESSION: 1.   This recording shows moderate global slowing indicating a moderate global encephalopathy.  However, there is no epileptiform activity is observed.      Avinash Maltos A. Merlene Laughter, M.D.  Diplomate, Tax adviser of Psychiatry and Neurology ( Neurology).

## 2017-07-18 NOTE — Plan of Care (Signed)
Problem: Nutrition: Goal: Adequate nutrition will be maintained Outcome: Progressing Tolerating Tube feedings well

## 2017-07-18 NOTE — Progress Notes (Signed)
PROGRESS NOTE    Reginald [name]  CBJ:[id number] DOB: Dec 29, 1955 DOA: 07/23/2017 PCP: Patient, [name] Per    Brief Narrative:  Reginald [name] is a 61 y.o. male with history of intellectual disability, HTN, dysphagia, muscle contractures, anxiety, PNA and polyneuropathy presented to ED today sent from [facility] facility for lethargy, hypotension and decreased mobility.  Pocketing food per staff.  Had fever at the SNF.  Got Rocepin 1 gm at the SNF this am before coming here.  Also his BP's were high at the SNF.    At baseline pt is reportedly alert and gives one-word responses typically.  In ED pt is somnolent and not responding verbally.  UA shows sig pyuria and hematuria.  CXR is negative and CT head shows nothing acute.  Got 1 L IVF's in the ED.    Patient is awake, makes eye contact, smiles a bit but no verbal responses.  Doesn't follow any commands.  No family here.    No old admissions in [facility].  Only in [place] is a dental caries procedure from 04/2014 at [facility].  Started on Ceftriaxone for urinary tract infection.  Somewhat improved but had a near arrest on 10/18 requiring intubation and was placed on levophed and transferred to the ICU. Troponin was found to be 9.5 and so cardiology consulted- recommended goals of care discussion and possibly starting heparin however, he began having seizure like activity on 10/19 and was loaded with keppra.  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  ETT starting showing some blood on 10/19 so heparin was not started.  Family meeting with palliative care medicine, legal guardian and family on 10/19.  Patient was made a DNR.  Neurology consulted for new seizures. Pulmonology consulted for ventilator management.       Assessment & Plan:   Active Problems:   Intellectual disability   Altered mental status   Acute lower UTI   Dehydration   Contractures of both knees   Debility   Bedbound   Essential  hypertension   Respiratory failure (HCC)   Palliative care encounter   Goals of care, counseling/discussion   Apnea   NSTEMI (non-ST elevated myocardial infarction) ([place])  S/p near arrest on 10/18 - intubated - troponin of 9.5- cardiology recommended heparin for 48-72 hours but not initiated 2/2 seizures initially and then blood in ETT tube - patient not a candidate for aggressive intervention - CT chest/abd/pelvis given elevated of transaminases but CT unrevealing - Pulmonology/ Dr. Luan Pulling following - off levophed now - cannot get MRI 2/2 ventilator - continues to require ventilatory support: patient not breathing above the machine - nutrition consulted for tube feeding recommendation - will need court order to take patient off ventilator  Altered mental status , acute on chronic  - discussed at length with patient's guardian on 10/19 patients decline since last year - per patient's guardian up until a year ago he was walking, talking, feeding himself and continent of bowel and bladder until an acute change - no explanation of acute change 1 year ago - guardian states she does not want anything that would cause discomfort or agitation to patient - family discussion with guardian and family on 10/19 patient made DNR - 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  - neurology consulted- recommend deceleration of care - poor respiratory effort when wean attempted   Seizure - new onsent - loaded with keppra - continue Keppra BID - clonazepam if patient develops more seizures -  hold ativan given elevated LFT's - neurology consulted: recommend deceleration of care  Anemia - decrease in H/H again - will hold lovenox - no signs of bleeding at this time  UTI  - urine Cx sent and showing less than 10k colony growth - CT of abd/ pelvis not showing any acute processes - rocephin stopped  Mentally retarded - per patient's guardian patient will have conversations with people  he likes but has been declining and would not engage in conversation with her this past week  Debility  - bilat LE contractures, skin in good condition  HTN  - hold antihypertensives - levophed off - continue to monitor BP   DVT prophylaxis: SCDs and lovenox Code Status: DNR Family Communication:    Disposition Plan: patient prognosis guarded- not candidate for aggressive intervention but guardian does not want that anyways. Will fax over summary of hospital course and prognosis to patient's guardian today     Consultants:   SLP  PCCM  Cardiology  Pulmonology  Neurology by phone  Procedures:   Central line placement  Antimicrobials:   Rocephin   Cefepime  Vancomycin   Subjective: Patient not responsive on ventilator.  Objective: Vitals:   07/17/17 2306 07/18/17 0441 07/18/17 0500 07/18/17 0700  BP:    (!) 112/55  Pulse:    67  Resp:    (!) 21  Temp:    (!) 101.1 F (38.4 C)  TempSrc:      SpO2: 98% 98%  98%  Weight:   59.3 kg (130 lb 11.7 oz)   Height:        Intake/Output Summary (Last 24 hours) at 07/18/17 0737 Last data filed at 07/18/17 0549  Gross per 24 hour  Intake          1876.25 ml  Output              650 ml  Net          1226.25 ml   Filed Weights   07/14/17 0500 07/15/17 0400 07/18/17 0500  Weight: 57.6 kg (126 lb 15.8 oz) 57.6 kg (126 lb 15.8 oz) 59.3 kg (130 lb 11.7 oz)    Examination:  General exam:sedated  Respiratory system: intubated. Symmetrical chest rise, no wheezes, or rales. No secretions noted in ETT Cardiovascular system:S1 &S2 heard, RRR. II/VI systolic murmur appreciated. No JVD, rubs, gallops or clicks. No pedal edema. Gastrointestinal system:Abdomen is nondistended, soft and nontender. No organomegaly or masses felt. soft bowel sounds heard. Central nervous system:Not able to assess as patient is sedated.  Extremities: Contracted upper and lower extremities. Skin: No rashes, lesions or  ulcers Psychiatry:sedated   Data Reviewed: I have personally reviewed following labs and imaging studies  CBC:  Recent Labs Lab 07/16/17 0438 07/16/17 1633 07/17/17 0532 07/17/17 1723 07/18/17 0444  WBC 8.8 7.7 8.4 8.2 8.4  NEUTROABS 7.7 6.4 6.5 6.5 6.4  HGB 9.0* 8.6* 8.5* 8.0* 7.6*  HCT 25.6* 24.5* 24.8* 23.2* 22.7*  MCV 81.5 82.2 83.2 83.5 84.7  PLT 148* 135* 160 158 557   Basic Metabolic Panel:  Recent Labs Lab 07/14/17 0912 07/14/17 1834 07/15/17 0425 07/16/17 0438 07/17/17 0532 07/18/17 0444  NA 129* 127* 135 143 146*  --   K 4.0 3.6 3.4* 3.1* 3.1*  --   CL 89* 89* 96* 105 110  --   CO2 '30 28 28 27 24  ' --   GLUCOSE 119* 126* 98 73 69  --   BUN 35* 39* 41*  41* 39*  --   CREATININE 2.26* 2.37* 2.15* 1.72* 1.61* 1.58*  CALCIUM 8.0* 7.8* 7.6* 7.8* 7.9*  --    GFR: Estimated Creatinine Clearance: 41.7 mL/min (A) (by C-G formula based on SCr of 1.58 mg/dL (H)). Liver Function Tests:  Recent Labs Lab 07/07/2017 1620 07/13/17 2305 07/14/17 0912  AST 36 456* 2,036*  ALT 58 783* 2,552*  ALKPHOS 167* 163* 152*  BILITOT 0.2* 0.6 0.6  PROT 6.8 6.2* 5.7*  ALBUMIN 3.3* 2.8* 2.6*   No results for input(s): LIPASE, AMYLASE in the last 168 hours. No results for input(s): AMMONIA in the last 168 hours. Coagulation Profile: No results for input(s): INR, PROTIME in the last 168 hours. Cardiac Enzymes:  Recent Labs Lab 07/14/17 0912 07/15/17 0425  TROPONINI 9.58* 5.76*   BNP (last 3 results) No results for input(s): PROBNP in the last 8760 hours. HbA1C: No results for input(s): HGBA1C in the last 72 hours. CBG:  Recent Labs Lab 07/13/17 2123  GLUCAP 121*   Lipid Profile: No results for input(s): CHOL, HDL, LDLCALC, TRIG, CHOLHDL, LDLDIRECT in the last 72 hours. Thyroid Function Tests: No results for input(s): TSH, T4TOTAL, FREET4, T3FREE, THYROIDAB in the last 72 hours. Anemia Panel: No results for input(s): VITAMINB12, FOLATE, FERRITIN, TIBC, IRON,  RETICCTPCT in the last 72 hours. Sepsis Labs:  Recent Labs Lab 07/22/2017 1625  LATICACIDVEN 1.1    Recent Results (from the past 240 hour(s))  Urine culture     Status: Abnormal   Collection Time: 07/07/2017  4:36 PM  Result Value Ref Range Status   Specimen Description URINE, CLEAN CATCH  Final   Special Requests NONE  Final   Culture (A)  Final    <10,000 COLONIES/mL INSIGNIFICANT GROWTH Performed at [facility], [address] [place], [place] [address]    Report Status 07/13/2017 FINAL  Final  MRSA PCR Screening     Status: None   Collection Time:  10:05 PM  Result Value Ref Range Status   MRSA by PCR NEGATIVE NEGATIVE Final    Comment:        The GeneXpert MRSA Assay (FDA approved for NASAL specimens only), is one component of a comprehensive MRSA colonization surveillance program. It is not intended to diagnose MRSA infection nor to guide or monitor treatment for MRSA infections.   Culture, blood (Routine X 2) w Reflex to ID Panel     Status: None (Preliminary result)   Collection Time: 07/13/17 10:58 PM  Result Value Ref Range Status   Specimen Description NECK  Final   Special Requests   Final    BOTTLES DRAWN AEROBIC AND ANAEROBIC Blood Culture adequate volume   Culture NO GROWTH 4 DAYS  Final   Report Status PENDING  Incomplete  Culture, blood (Routine X 2) w Reflex to ID Panel     Status: None (Preliminary result)   Collection Time: 07/13/17 11:05 PM  Result Value Ref Range Status   Specimen Description BLOOD RIGHT ARM  Final   Special Requests   Final    BOTTLES DRAWN AEROBIC AND ANAEROBIC Blood Culture adequate volume   Culture NO GROWTH 4 DAYS  Final   Report Status PENDING  Incomplete  MRSA PCR Screening     Status: Abnormal   Collection Time: 07/14/17  7:39 AM  Result Value Ref Range Status   MRSA by PCR INVALID RESULTS, SPECIMEN SENT FOR CULTURE (A) NEGATIVE Final    Comment: HYLTON L. AT 4782 BY THOMPSON S.  ON [id number]        The  GeneXpert MRSA Assay (FDA approved for NASAL specimens only), is one component of a comprehensive MRSA colonization surveillance program. It is not intended to diagnose MRSA infection nor to guide or monitor treatment for MRSA infections.   MRSA culture     Status: None   Collection Time: 07/14/17  7:39 AM  Result Value Ref Range Status   Specimen Description NOSE  Final   Special Requests NONE  Final   Culture   Final    NO MRSA DETECTED Performed at [facility], [address], [place], [place] [address]    Report Status 07/15/2017 FINAL  Final         Radiology Studies: No results found.      Scheduled Meds: . aspirin  325 mg Oral Daily  . chlorhexidine gluconate (MEDLINE KIT)  15 mL Mouth Rinse BID  . Chlorhexidine Gluconate Cloth  6 each Topical Daily  . docusate sodium  100 mg Oral BID  . enoxaparin (LOVENOX) injection  40 mg Subcutaneous Q24H  . mouth rinse  15 mL Mouth Rinse QID  . mupirocin ointment  1 application Nasal BID  . pantoprazole (PROTONIX) IV  40 mg Intravenous Q12H  . sodium chloride flush  10-40 mL Intracatheter Q12H   Continuous Infusions: . sodium chloride 75 mL/hr at 07/17/17 2253  . ceFEPime (MAXIPIME) IV Stopped (07/18/17 9672)  . feeding supplement (VITAL AF 1.2 CAL) 1,000 mL (07/18/17 0445)  . fentaNYL infusion INTRAVENOUS 100 mcg/hr (07/17/17 2009)  . levETIRAcetam Stopped (07/17/17 2245)  . norepinephrine (LEVOPHED) Adult infusion Stopped (07/16/17 0500)  . vancomycin Stopped (07/17/17 1930)     LOS: 4 days    Time spent: 40 minutes critical care time    Loretha Stapler, MD [facility] Pager [phone]  If 7PM-7AM, please contact night-coverage [email or web address] Password [facility] 07/18/2017, 7:37 AM

## 2017-07-18 NOTE — Progress Notes (Signed)
Subjective: He remains intubated and on the ventilator. He has now been seen by neurology and it is felt that prognosis for functional outcome is poor. EEG has been ordered. This is for potential seizures. Weaning was attempted per protocol yesterday but he had poor ventilatory effort. He also required higher than usual levels of pressure support.  Objective: Vital signs in last 24 hours: Temp:  [99.5 F (37.5 C)-101.1 F (38.4 C)] 100 F (37.8 C) (10/23 0740) Pulse Rate:  [67-102] 67 (10/23 0700) Resp:  [12-21] 21 (10/23 0700) BP: (105-137)/(51-64) 112/55 (10/23 0700) SpO2:  [98 %-99 %] 98 % (10/23 0700) FiO2 (%):  [35 %] 35 % (10/23 0441) Weight:  [59.3 kg (130 lb 11.7 oz)] 59.3 kg (130 lb 11.7 oz) (10/23 0500) Weight change:  Last BM Date: 07/14/2017  Intake/Output from previous day: 10/22 0701 - 10/23 0700 In: 1876.3 [I.V.:1388.3; NG/GT:51; IV Piggyback:355] Out: 650 [Urine:650]  PHYSICAL EXAM General appearance: Intubated sedated on mechanical ventilation Resp: rhonchi bilaterally Cardio: regular rate and rhythm, S1, S2 normal, no murmur, click, rub or gallop GI: soft, non-tender; bowel sounds normal; no masses,  no organomegaly Extremities: extremities normal, atraumatic, no cyanosis or edema He has contractures. Cannot assess his neurological status  Lab Results:  Results for orders placed or performed during the hospital encounter of 07/03/2017 (from the past 48 hour(s))  CBC with Differential/Platelet     Status: Abnormal   Collection Time: 07/16/17  4:33 PM  Result Value Ref Range   WBC 7.7 4.0 - 10.5 K/uL   RBC 2.98 (L) 4.22 - 5.81 MIL/uL   Hemoglobin 8.6 (L) 13.0 - 17.0 g/dL   HCT 24.5 (L) 39.0 - 52.0 %   MCV 82.2 78.0 - 100.0 fL   MCH 28.9 26.0 - 34.0 pg   MCHC 35.1 30.0 - 36.0 g/dL   RDW 14.3 11.5 - 15.5 %   Platelets 135 (L) 150 - 400 K/uL   Neutrophils Relative % 83 %   Neutro Abs 6.4 1.7 - 7.7 K/uL   Lymphocytes Relative 7 %   Lymphs Abs 0.6 (L) 0.7 - 4.0  K/uL   Monocytes Relative 9 %   Monocytes Absolute 0.7 0.1 - 1.0 K/uL   Eosinophils Relative 1 %   Eosinophils Absolute 0.1 0.0 - 0.7 K/uL   Basophils Relative 0 %   Basophils Absolute 0.0 0.0 - 0.1 K/uL  Basic metabolic panel     Status: Abnormal   Collection Time: 07/17/17  5:32 AM  Result Value Ref Range   Sodium 146 (H) 135 - 145 mmol/L   Potassium 3.1 (L) 3.5 - 5.1 mmol/L   Chloride 110 101 - 111 mmol/L   CO2 24 22 - 32 mmol/L   Glucose, Bld 69 65 - 99 mg/dL   BUN 39 (H) 6 - 20 mg/dL   Creatinine, Ser 1.61 (H) 0.61 - 1.24 mg/dL   Calcium 7.9 (L) 8.9 - 10.3 mg/dL   GFR calc non Af Amer 45 (L) >60 mL/min   GFR calc Af Amer 52 (L) >60 mL/min    Comment: (NOTE) The eGFR has been calculated using the CKD EPI equation. This calculation has not been validated in all clinical situations. eGFR's persistently <60 mL/min signify possible Chronic Kidney Disease.    Anion gap 12 5 - 15  CBC with Differential/Platelet     Status: Abnormal   Collection Time: 07/17/17  5:32 AM  Result Value Ref Range   WBC 8.4 4.0 - 10.5 K/uL  RBC 2.98 (L) 4.22 - 5.81 MIL/uL   Hemoglobin 8.5 (L) 13.0 - 17.0 g/dL   HCT 24.8 (L) 39.0 - 52.0 %   MCV 83.2 78.0 - 100.0 fL   MCH 28.5 26.0 - 34.0 pg   MCHC 34.3 30.0 - 36.0 g/dL   RDW 14.5 11.5 - 15.5 %   Platelets 160 150 - 400 K/uL   Neutrophils Relative % 77 %   Neutro Abs 6.5 1.7 - 7.7 K/uL   Lymphocytes Relative 9 %   Lymphs Abs 0.8 0.7 - 4.0 K/uL   Monocytes Relative 12 %   Monocytes Absolute 1.0 0.1 - 1.0 K/uL   Eosinophils Relative 2 %   Eosinophils Absolute 0.2 0.0 - 0.7 K/uL   Basophils Relative 0 %   Basophils Absolute 0.0 0.0 - 0.1 K/uL  CBC with Differential/Platelet     Status: Abnormal   Collection Time: 07/17/17  5:23 PM  Result Value Ref Range   WBC 8.2 4.0 - 10.5 K/uL   RBC 2.78 (L) 4.22 - 5.81 MIL/uL   Hemoglobin 8.0 (L) 13.0 - 17.0 g/dL   HCT 23.2 (L) 39.0 - 52.0 %   MCV 83.5 78.0 - 100.0 fL   MCH 28.8 26.0 - 34.0 pg   MCHC  34.5 30.0 - 36.0 g/dL   RDW 14.4 11.5 - 15.5 %   Platelets 158 150 - 400 K/uL   Neutrophils Relative % 80 %   Neutro Abs 6.5 1.7 - 7.7 K/uL   Lymphocytes Relative 7 %   Lymphs Abs 0.6 (L) 0.7 - 4.0 K/uL   Monocytes Relative 12 %   Monocytes Absolute 1.0 0.1 - 1.0 K/uL   Eosinophils Relative 1 %   Eosinophils Absolute 0.1 0.0 - 0.7 K/uL   Basophils Relative 0 %   Basophils Absolute 0.0 0.0 - 0.1 K/uL  Creatinine, serum     Status: Abnormal   Collection Time: 07/18/17  4:44 AM  Result Value Ref Range   Creatinine, Ser 1.58 (H) 0.61 - 1.24 mg/dL   GFR calc non Af Amer 46 (L) >60 mL/min   GFR calc Af Amer 53 (L) >60 mL/min    Comment: (NOTE) The eGFR has been calculated using the CKD EPI equation. This calculation has not been validated in all clinical situations. eGFR's persistently <60 mL/min signify possible Chronic Kidney Disease.   CBC with Differential/Platelet     Status: Abnormal   Collection Time: 07/18/17  4:44 AM  Result Value Ref Range   WBC 8.4 4.0 - 10.5 K/uL   RBC 2.68 (L) 4.22 - 5.81 MIL/uL   Hemoglobin 7.6 (L) 13.0 - 17.0 g/dL   HCT 22.7 (L) 39.0 - 52.0 %   MCV 84.7 78.0 - 100.0 fL   MCH 28.4 26.0 - 34.0 pg   MCHC 33.5 30.0 - 36.0 g/dL   RDW 14.6 11.5 - 15.5 %   Platelets 177 150 - 400 K/uL   Neutrophils Relative % 77 %   Neutro Abs 6.4 1.7 - 7.7 K/uL   Lymphocytes Relative 8 %   Lymphs Abs 0.7 0.7 - 4.0 K/uL   Monocytes Relative 14 %   Monocytes Absolute 1.2 (H) 0.1 - 1.0 K/uL   Eosinophils Relative 1 %   Eosinophils Absolute 0.1 0.0 - 0.7 K/uL   Basophils Relative 0 %   Basophils Absolute 0.0 0.0 - 0.1 K/uL    ABGS No results for input(s): PHART, PO2ART, TCO2, HCO3 in the last 72 hours.  Invalid input(s):  PCO2 CULTURES Recent Results (from the past 240 hour(s))  Urine culture     Status: Abnormal   Collection Time: 07/18/2017  4:36 PM  Result Value Ref Range Status   Specimen Description URINE, CLEAN CATCH  Final   Special Requests NONE  Final    Culture (A)  Final    <10,000 COLONIES/mL INSIGNIFICANT GROWTH Performed at [facility], [address], [place], [place] [address]    Report Status 07/13/2017 FINAL  Final  MRSA PCR Screening     Status: None   Collection Time: 07/14/2017 10:05 PM  Result Value Ref Range Status   MRSA by PCR NEGATIVE NEGATIVE Final    Comment:        The GeneXpert MRSA Assay (FDA approved for NASAL specimens only), is one component of a comprehensive MRSA colonization surveillance program. It is not intended to diagnose MRSA infection nor to guide or monitor treatment for MRSA infections.   Culture, blood (Routine X 2) w Reflex to ID Panel     Status: None (Preliminary result)   Collection Time: 07/13/17 10:58 PM  Result Value Ref Range Status   Specimen Description NECK  Final   Special Requests   Final    BOTTLES DRAWN AEROBIC AND ANAEROBIC Blood Culture adequate volume   Culture NO GROWTH 4 DAYS  Final   Report Status PENDING  Incomplete  Culture, blood (Routine X 2) w Reflex to ID Panel     Status: None (Preliminary result)   Collection Time: 07/13/17 11:05 PM  Result Value Ref Range Status   Specimen Description BLOOD RIGHT ARM  Final   Special Requests   Final    BOTTLES DRAWN AEROBIC AND ANAEROBIC Blood Culture adequate volume   Culture NO GROWTH 4 DAYS  Final   Report Status PENDING  Incomplete  MRSA PCR Screening     Status: Abnormal   Collection Time: 07/14/17  7:39 AM  Result Value Ref Range Status   MRSA by PCR INVALID RESULTS, SPECIMEN SENT FOR CULTURE (A) NEGATIVE Final    Comment: HYLTON L. AT 4742 BY THOMPSON S. ON [id number]        The GeneXpert MRSA Assay (FDA approved for NASAL specimens only), is one component of a comprehensive MRSA colonization surveillance program. It is not intended to diagnose MRSA infection nor to guide or monitor treatment for MRSA infections.   MRSA culture     Status: None   Collection Time: 07/14/17  7:39 AM  Result Value Ref  Range Status   Specimen Description NOSE  Final   Special Requests NONE  Final   Culture   Final    NO MRSA DETECTED Performed at [facility], [address] [place], [place] [address]    Report Status 07/15/2017 FINAL  Final   Studies/Results: No results found.  Medications:  Prior to Admission:  Prescriptions Prior to Admission  Medication Sig Dispense Refill Last Dose  . acetaminophen (TYLENOL) 325 MG tablet Take 650 mg by mouth 3 (three) times daily.   06/29/2017 at Unknown time  . acetaminophen (TYLENOL) 500 MG tablet Take 1,000 mg by mouth every 6 (six) hours as needed for mild pain or moderate pain.   unknown  . ALPRAZolam (XANAX) 0.25 MG tablet Take 0.25 mg by mouth 2 (two) times daily.    07/02/2017 at Unknown time  . aspirin EC 81 MG tablet Take 81 mg by mouth daily.   07/13/2017 at Unknown time  . baclofen (  LIORESAL) 10 MG tablet Take 10 mg by mouth 3 (three) times daily.   07/03/2017 at Unknown time  . busPIRone (BUSPAR) 7.5 MG tablet Take 7.5 mg by mouth 2 (two) times daily.   06/26/2017 at Unknown time  . cefTRIAXone (ROCEPHIN) 1 g injection Inject 2 g into the muscle once.   06/27/2017 at Unknown time  . chlorhexidine (PERIDEX) 0.12 % solution Use as directed 15 mLs in the mouth or throat 2 (two) times daily.   07/23/2017 at Unknown time  . cholecalciferol (VITAMIN D) 1000 units tablet Take 1,000 Units by mouth daily.   07/19/2017 at Unknown time  . cloNIDine (CATAPRES) 0.2 MG tablet Take 0.2 mg by mouth once.   07/18/2017 at Unknown time  . gabapentin (NEURONTIN) 600 MG tablet Take 600 mg by mouth at bedtime.    07/10/2017 at Unknown time  . HYDROcodone-acetaminophen (NORCO) 10-325 MG tablet Take 1 tablet by mouth 3 (three) times daily.    07/23/2017 at Unknown time  . lisinopril (PRINIVIL,ZESTRIL) 20 MG tablet Take 20 mg by mouth daily.   07/12/2017 at Unknown time  . Melatonin 3 MG TABS Take 3 mg by mouth at bedtime.   07/10/2017 at Unknown time  . Multiple  Vitamin (MULTIVITAMIN WITH MINERALS) TABS tablet Take 1 tablet by mouth daily.   07/16/2017 at Unknown time  . OXYGEN Inhale into the lungs daily.   06/27/2017 at Unknown time   Scheduled: . aspirin  325 mg Oral Daily  . chlorhexidine gluconate (MEDLINE KIT)  15 mL Mouth Rinse BID  . Chlorhexidine Gluconate Cloth  6 each Topical Daily  . docusate sodium  100 mg Oral BID  . enoxaparin (LOVENOX) injection  40 mg Subcutaneous Q24H  . mouth rinse  15 mL Mouth Rinse QID  . mupirocin ointment  1 application Nasal BID  . pantoprazole (PROTONIX) IV  40 mg Intravenous Q12H  . sodium chloride flush  10-40 mL Intracatheter Q12H   Continuous: . sodium chloride 75 mL/hr at 07/17/17 2253  . ceFEPime (MAXIPIME) IV Stopped (07/18/17 1779)  . feeding supplement (VITAL AF 1.2 CAL) 1,000 mL (07/18/17 0445)  . fentaNYL infusion INTRAVENOUS 100 mcg/hr (07/17/17 2009)  . levETIRAcetam Stopped (07/17/17 2245)  . norepinephrine (LEVOPHED) Adult infusion Stopped (07/16/17 0500)  . vancomycin Stopped (07/17/17 1930)   TJQ:[id number] **OR** acetaminophen, fentaNYL, LORazepam, polyethylene glycol, polyvinyl alcohol, sodium chloride flush, sodium phosphate  Assesment: He was admitted with what appeared to be UTI had near respiratory arrest requiring intubation and mechanical ventilation. He has poor respiratory effort.  He had a non-STEMI during his near respiratory arrest.  He may have aspirated.  He has hypertension at baseline  At baseline he was bedbound with contractures and intellectual disability possibly cerebral palsy per neurology.  He has had bilateral large vessel cerebellar infarctions  He has had what may be seizures.  He is a ward of the state. I don't think are going to be able to wean him traditionally and apparently it takes a court order to take him off the ventilator under any other circumstances Active Problems:   Intellectual disability   Altered mental status   Acute lower  UTI   Dehydration   Contractures of both knees   Debility   Bedbound   Essential hypertension   Respiratory failure ([place])   Palliative care encounter   Goals of care, counseling/discussion   Apnea   NSTEMI (non-ST elevated myocardial infarction) ([place])    Plan: His prognosis is poor. Agree  with DO NOT RESUSCITATE status.    LOS: 4 days   Chong January L 07/18/2017, 8:15 AM

## 2017-07-18 NOTE — Consult Note (Addendum)
[place]os[place] A. Merlene Laughter, MD     [email or web address]          Reginald [name] is an 61 y.o. male.   ASSESSMENT/PLAN: 1. Multifactorial acute encephalopathy mostly due to acute medical illness:  Given the severe baseline static encephalopathy, pulmonary status and acute infarcts, prognosis for functional outcome is grim.  I recommend considering deceleration of care and determine wean. 2. 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   Aspirin will be started. 4. Questionable seizure activity:  He has been placed on Keppra previously because of this.  We will check an EEG.     I did get a better history of the patient by his niece.  She tells me that he does have moderately severe cognitive impairment at baseline.  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  He was able however to speaking to three word sentences.  He was able to do his ADLs with minimal assistance including walking, hygiene and feed himself.  Unfortunately, he had some acute event about a year ago which is presumed to be a stroke.  This left him with severe function impairment requiring total assistance for all his care. I did discuss with her is grim prognosis and the that the most humane course of action is to do a terminal wean and deceleration of care.  She seems very open to this.    GENERAL:    He he is intubated.  HEENT:    There is no dysmorphic features.  Neck is supple.  ABDOMEN: soft  EXTREMITIES: No edema ;  There is severe contractures of the upper and lower extremities.   BACK: this is unremarkable  SKIN: Normal by inspection.    MENTAL STATUS:    CRANIAL NERVES: Pupils are equal, round and reactive to light;  Eyes deviated to the R today but are overcome with oculocephalic reflexes; corneal reflexes are intact; visual fields -  Limited but appears  to be full; upper and lower facial muscles are normal in strength and symmetric, there is no flattening of the nasolabial folds;   MOTOR:  Severe spasticity and contractions with 2/5 strength on both sides  COORDINATION:  There is no evidence of dysmetria or tremors.  REFLEXES: Deep tendon reflexes are symmetrical but diminished throughout.   SENSATION:   He responds to painful stimuli bilaterally.     Blood pressure (!) 87/56, pulse 84, temperature 98.7 F (37.1 C), temperature source Axillary, resp. rate 10, height _0  (1.651 m), weight 130 lb 11.7 oz (59.3 kg), SpO2 100 %.  Past Medical History:  Diagnosis Date  . Abnormal posture   . Anxiety   . Cognitive communication deficit   . Contracture of muscle   . Dysphagia   . History of pneumonia   . Hypertension   . Insomnia   . Moderate intellectual disabilities   . Polyneuropathy     History reviewed. No pertinent surgical history.  Family History  Problem Relation Age of Onset  . Family history unknown: Yes    Social History:  reports that he has never smoked. He has never used smokeless tobacco. He reports that he does not drink alcohol or use drugs.  Allergies: No Known Allergies  Medications: Prior to Admission medications   Medication Sig Start Date End Date Taking? Authorizing Provider  acetaminophen (TYLENOL) 325 MG tablet Take 650 mg by mouth 3 (  three) times daily.   Yes [provider]  acetaminophen (TYLENOL) 500 MG tablet Take 1,000 mg by mouth every 6 (six) hours as needed for mild pain or moderate pain.   Yes [provider]  ALPRAZolam (XANAX) 0.25 MG tablet Take 0.25 mg by mouth 2 (two) times daily.  06/26/2017  Yes [provider]  aspirin EC 81 MG tablet Take 81 mg by mouth daily.   Yes [provider]  baclofen (LIORESAL) 10 MG tablet Take 10 mg by mouth 3 (three) times daily. 06/18/17  Yes [provider]  busPIRone (BUSPAR) 7.5 MG tablet Take 7.5 mg by  mouth 2 (two) times daily. 06/15/17  Yes [provider]  cefTRIAXone (ROCEPHIN) 1 g injection Inject 2 g into the muscle once.   Yes [provider]  chlorhexidine (PERIDEX) 0.12 % solution Use as directed 15 mLs in the mouth or throat 2 (two) times daily.   Yes [provider]  cholecalciferol (VITAMIN D) 1000 units tablet Take 1,000 Units by mouth daily.   Yes [provider]  cloNIDine (CATAPRES) 0.2 MG tablet Take 0.2 mg by mouth once.   Yes [provider]  gabapentin (NEURONTIN) 600 MG tablet Take 600 mg by mouth at bedtime.  06/05/17  Yes [provider]  HYDROcodone-acetaminophen (NORCO) 10-325 MG tablet Take 1 tablet by mouth 3 (three) times daily.  06/09/17  Yes [provider]  lisinopril (PRINIVIL,ZESTRIL) 20 MG tablet Take 20 mg by mouth daily. 06/26/17  Yes [provider]  Melatonin 3 MG TABS Take 3 mg by mouth at bedtime.   Yes [provider]  Multiple Vitamin (MULTIVITAMIN WITH MINERALS) TABS tablet Take 1 tablet by mouth daily.   Yes [provider]  OXYGEN Inhale into the lungs daily.   Yes [provider]    Scheduled Meds: . aspirin  325 mg Oral Daily  . chlorhexidine gluconate (MEDLINE KIT)  15 mL Mouth Rinse BID  . Chlorhexidine Gluconate Cloth  6 each Topical Daily  . docusate sodium  100 mg Oral BID  . mouth rinse  15 mL Mouth Rinse QID  . mupirocin ointment  1 application Nasal BID  . pantoprazole (PROTONIX) IV  40 mg Intravenous Q12H  . sodium chloride flush  10-40 mL Intracatheter Q12H   Continuous Infusions: . ceFEPime (MAXIPIME) IV Stopped (07/18/17 2774)  . feeding supplement (VITAL AF 1.2 CAL) 1,000 mL (07/18/17 1712)  . fentaNYL infusion INTRAVENOUS 100 mcg/hr (07/18/17 2119)  . levETIRAcetam 500 mg (07/18/17 2123)  . norepinephrine (LEVOPHED) Adult infusion Stopped (07/16/17 0500)   PRN Meds:.acetaminophen **OR** acetaminophen, bisacodyl, fentaNYL,  LORazepam, polyethylene glycol, polyvinyl alcohol, sodium chloride flush, sodium phosphate     Results for orders placed or performed during the hospital encounter of 07/19/2017 (from the past 48 hour(s))  Basic metabolic panel     Status: Abnormal   Collection Time: 07/17/17  5:32 AM  Result Value Ref Range   Sodium 146 (H) 135 - 145 mmol/L   Potassium 3.1 (L) 3.5 - 5.1 mmol/L   Chloride 110 101 - 111 mmol/L   CO2 24 22 - 32 mmol/L   Glucose, Bld 69 65 - 99 mg/dL   BUN 39 (H) 6 - 20 mg/dL   Creatinine, Ser 1.61 (H) 0.61 - 1.24 mg/dL   Calcium 7.9 (L) 8.9 - 10.3 mg/dL   GFR calc non Af Amer 45 (L) >60 mL/min   GFR calc Af Amer 52 (L) >60 mL/min  Comment: (NOTE) The eGFR has been calculated using the CKD EPI equation. This calculation has not been validated in all clinical situations. eGFR's persistently <60 mL/min signify possible Chronic Kidney Disease.    Anion gap 12 5 - 15  CBC with Differential/Platelet     Status: Abnormal   Collection Time: 07/17/17  5:32 AM  Result Value Ref Range   WBC 8.4 4.0 - 10.5 K/uL   RBC 2.98 (L) 4.22 - 5.81 MIL/uL   Hemoglobin 8.5 (L) 13.0 - 17.0 g/dL   HCT 24.8 (L) 39.0 - 52.0 %   MCV 83.2 78.0 - 100.0 fL   MCH 28.5 26.0 - 34.0 pg   MCHC 34.3 30.0 - 36.0 g/dL   RDW 14.5 11.5 - 15.5 %   Platelets 160 150 - 400 K/uL   Neutrophils Relative % 77 %   Neutro Abs 6.5 1.7 - 7.7 K/uL   Lymphocytes Relative 9 %   Lymphs Abs 0.8 0.7 - 4.0 K/uL   Monocytes Relative 12 %   Monocytes Absolute 1.0 0.1 - 1.0 K/uL   Eosinophils Relative 2 %   Eosinophils Absolute 0.2 0.0 - 0.7 K/uL   Basophils Relative 0 %   Basophils Absolute 0.0 0.0 - 0.1 K/uL  CBC with Differential/Platelet     Status: Abnormal   Collection Time: 07/17/17  5:23 PM  Result Value Ref Range   WBC 8.2 4.0 - 10.5 K/uL   RBC 2.78 (L) 4.22 - 5.81 MIL/uL   Hemoglobin 8.0 (L) 13.0 - 17.0 g/dL   HCT 23.2 (L) 39.0 - 52.0 %   MCV 83.5 78.0 - 100.0 fL   MCH 28.8 26.0 - 34.0 pg   MCHC  34.5 30.0 - 36.0 g/dL   RDW 14.4 11.5 - 15.5 %   Platelets 158 150 - 400 K/uL   Neutrophils Relative % 80 %   Neutro Abs 6.5 1.7 - 7.7 K/uL   Lymphocytes Relative 7 %   Lymphs Abs 0.6 (L) 0.7 - 4.0 K/uL   Monocytes Relative 12 %   Monocytes Absolute 1.0 0.1 - 1.0 K/uL   Eosinophils Relative 1 %   Eosinophils Absolute 0.1 0.0 - 0.7 K/uL   Basophils Relative 0 %   Basophils Absolute 0.0 0.0 - 0.1 K/uL  Creatinine, serum     Status: Abnormal   Collection Time: 07/18/17  4:44 AM  Result Value Ref Range   Creatinine, Ser 1.58 (H) 0.61 - 1.24 mg/dL   GFR calc non Af Amer 46 (L) >60 mL/min   GFR calc Af Amer 53 (L) >60 mL/min    Comment: (NOTE) The eGFR has been calculated using the CKD EPI equation. This calculation has not been validated in all clinical situations. eGFR's persistently <60 mL/min signify possible Chronic Kidney Disease.   CBC with Differential/Platelet     Status: Abnormal   Collection Time: 07/18/17  4:44 AM  Result Value Ref Range   WBC 8.4 4.0 - 10.5 K/uL   RBC 2.68 (L) 4.22 - 5.81 MIL/uL   Hemoglobin 7.6 (L) 13.0 - 17.0 g/dL   HCT 22.7 (L) 39.0 - 52.0 %   MCV 84.7 78.0 - 100.0 fL   MCH 28.4 26.0 - 34.0 pg   MCHC 33.5 30.0 - 36.0 g/dL   RDW 14.6 11.5 - 15.5 %   Platelets 177 150 - 400 K/uL   Neutrophils Relative % 77 %   Neutro Abs 6.4 1.7 - 7.7 K/uL   Lymphocytes Relative 8 %  Lymphs Abs 0.7 0.7 - 4.0 K/uL   Monocytes Relative 14 %   Monocytes Absolute 1.2 (H) 0.1 - 1.0 K/uL   Eosinophils Relative 1 %   Eosinophils Absolute 0.1 0.0 - 0.7 K/uL   Basophils Relative 0 %   Basophils Absolute 0.0 0.0 - 0.1 K/uL  CBC with Differential/Platelet     Status: Abnormal   Collection Time: 07/18/17  4:37 PM  Result Value Ref Range   WBC 8.6 4.0 - 10.5 K/uL   RBC 2.72 (L) 4.22 - 5.81 MIL/uL   Hemoglobin 7.8 (L) 13.0 - 17.0 g/dL   HCT 23.3 (L) 39.0 - 52.0 %   MCV 85.7 78.0 - 100.0 fL   MCH 28.7 26.0 - 34.0 pg   MCHC 33.5 30.0 - 36.0 g/dL   RDW 14.6 11.5 -  15.5 %   Platelets 176 150 - 400 K/uL   Neutrophils Relative % 76 %   Neutro Abs 6.6 1.7 - 7.7 K/uL   Lymphocytes Relative 7 %   Lymphs Abs 0.6 (L) 0.7 - 4.0 K/uL   Monocytes Relative 15 %   Monocytes Absolute 1.3 (H) 0.1 - 1.0 K/uL   Eosinophils Relative 2 %   Eosinophils Absolute 0.1 0.0 - 0.7 K/uL   Basophils Relative 0 %   Basophils Absolute 0.0 0.0 - 0.1 K/uL    Studies/Results:   Nataleigh Griffin A. Merlene Laughter, M.D.  Diplomate, Tax adviser of Psychiatry and Neurology ( Neurology). 07/18/2017, 10:35 PM

## 2017-07-18 NOTE — Progress Notes (Signed)
Pharmacy Antibiotic Note  Reginald [name] is a 61 y.o. male admitted on 2017/09/10 with sepsis.  Pharmacy has been consulted for  Cefepime dosing.    D#6 of Cefepime. Patient with poor prognosis. Cultures negative to date. Not candidate for aggressive intervention. Consider deescalation of antibiotic after 7 days.   Plan:  Continue Cefepime to 1 gm IV q24h Monitor labs, renal fxn, progress and c/s Deescalate ABX when improved / appropriate.    Height: 5\' 5"  (165.1 cm) Weight: 130 lb 11.7 oz (59.3 kg) IBW/kg (Calculated) : 61.5  Temp (24hrs), Avg:100.7 F (38.2 C), Min:99.1 F (37.3 C), Max:101.1 F (38.4 C)   Recent Labs Lab 09/06/2017 1625  07/14/17 1834 07/15/17 0425 07/16/17 0438 07/16/17 1633 07/17/17 0532 07/17/17 1723 07/18/17 0444  WBC  --   < >  --   --  8.8 7.7 8.4 8.2 8.4  CREATININE  --   < > 2.37* 2.15* 1.72*  --  1.61*  --  1.58*  LATICACIDVEN 1.1  --   --   --   --   --   --   --   --   < > = values in this interval not displayed.  Estimated Creatinine Clearance: 41.7 mL/min (A) (by C-G formula based on SCr of 1.58 mg/dL (H)).    No Known Allergies  Antimicrobials this admission: Cefepime 10/18 >>  Vancomycin 10/18 >> 10/23  Dose adjustments this admission:  10/19 reduced dosing due to worsening SCr                                                               [facility]  Microbiology results:  10/18BCx ngtd 10/16 UCx, insignificant growth 10/19 MRSA PCR neg  Thank you for allowing pharmacy to be a part of this patient's care.  Elder CyphersLorie Ian Cavey, BS Loura Backharm D, [place] Clinical Pharmacist Pager [phone] 07/18/2017 12:36 PM

## 2017-07-18 NOTE — Progress Notes (Signed)
EEG completed; results pending.    

## 2017-07-18 NOTE — Care Management Note (Signed)
Case Management Note  Patient Details  Name: Reginald [name] MRN: [id number] Date of Birth: Feb 04, 1956   If discussed at Long Length of Stay Meetings, dates discussed:  07/18/2017  Additional Comments:  Santiago Stenzel, Chrystine OilerSharley Diane, RN 07/18/2017, 12:46 PM

## 2017-07-19 DIAGNOSIS — Z515 Encounter for palliative care: Secondary | ICD-10-CM

## 2017-07-19 LAB — CBC WITH DIFFERENTIAL/PLATELET
BASOS ABS: 0 10*3/uL (ref 0.0–0.1)
Basophils Relative: 0 %
Eosinophils Absolute: 0.2 10*3/uL (ref 0.0–0.7)
Eosinophils Relative: 2 %
HEMATOCRIT: 22.3 % — AB (ref 39.0–52.0)
HEMOGLOBIN: 7.6 g/dL — AB (ref 13.0–17.0)
LYMPHS PCT: 8 %
Lymphs Abs: 0.7 10*3/uL (ref 0.7–4.0)
MCH: 29 pg (ref 26.0–34.0)
MCHC: 34.1 g/dL (ref 30.0–36.0)
MCV: 85.1 fL (ref 78.0–100.0)
Monocytes Absolute: 1 10*3/uL (ref 0.1–1.0)
Monocytes Relative: 11 %
NEUTROS ABS: 7 10*3/uL (ref 1.7–7.7)
NEUTROS PCT: 79 %
Platelets: 200 10*3/uL (ref 150–400)
RBC: 2.62 MIL/uL — AB (ref 4.22–5.81)
RDW: 14.6 % (ref 11.5–15.5)
WBC: 8.9 10*3/uL (ref 4.0–10.5)

## 2017-07-19 LAB — BASIC METABOLIC PANEL
ANION GAP: 6 (ref 5–15)
BUN: 35 mg/dL — ABNORMAL HIGH (ref 6–20)
CO2: 26 mmol/L (ref 22–32)
Calcium: 7.7 mg/dL — ABNORMAL LOW (ref 8.9–10.3)
Chloride: 120 mmol/L — ABNORMAL HIGH (ref 101–111)
Creatinine, Ser: 1.33 mg/dL — ABNORMAL HIGH (ref 0.61–1.24)
GFR, EST NON AFRICAN AMERICAN: 57 mL/min — AB (ref >60)
GLUCOSE: 170 mg/dL — AB (ref 65–99)
POTASSIUM: 3.8 mmol/L (ref 3.5–5.1)
SODIUM: 152 mmol/L — AB (ref 135–145)

## 2017-07-19 MED ORDER — DEXTROSE 5 % IV SOLN: 2.0000 g | INTRAVENOUS | Status: DC

## 2017-07-19 MED ORDER — FREE WATER
250.0000 mL | Freq: Four times a day (QID) | Status: DC
  Administered 2017-07-19 – 2017-07-20 (×4): 250 mL

## 2017-07-19 NOTE — Progress Notes (Signed)
Pharmacy Antibiotic Note  Halina MaidensLonnie Storrs is a 61 y.o. male admitted on 11-Dec-2016 with sepsis.  Pharmacy has been consulted for  Cefepime dosing.    D#7 of Cefepime. Patient with poor prognosis. Cultures negative to date. Not candidate for aggressive intervention. Consider deescalation of antibiotic after 7 days.  Renal fxn has improved.   Plan:  Cefepime to 2 gm IV q24h Monitor labs, renal fxn, progress and c/s Deescalate ABX when improved / appropriate.    Height: 5\' 5"  (165.1 cm) Weight: 132 lb 7.9 oz (60.1 kg) IBW/kg (Calculated) : 61.5  Temp (24hrs), Avg:98.9 F (37.2 C), Min:98.3 F (36.8 C), Max:99.3 F (37.4 C)   Recent Labs Lab 07/15/17 0425 07/16/17 0438  07/17/17 0532 07/17/17 1723 07/18/17 0444 07/18/17 1637 07/19/17 0516  WBC  --  8.8  < > 8.4 8.2 8.4 8.6 8.9  CREATININE 2.15* 1.72*  --  1.61*  --  1.58*  --  1.33*  < > = values in this interval not displayed.  Estimated Creatinine Clearance: 50.2 mL/min (A) (by C-G formula based on SCr of 1.33 mg/dL (H)).    No Known Allergies  Antimicrobials this admission: Cefepime 10/18 >>  Vancomycin 10/18 >> 10/23  Dose adjustments this admission:  10/24 Increased Cefepime to 2gm (improved SCr)  Microbiology results:  10/18BCx ngtd 10/16 UCx, insignificant growth 10/19 MRSA PCR neg  Thank you for allowing pharmacy to be a part of this patient's care.  Valrie HartScott Brittanie Dosanjh, PharmD Clinical Pharmacist Pager:  [phone] 07/19/2017

## 2017-07-19 NOTE — Progress Notes (Signed)
Subjective: He remains on the ventilator. Unresponsive.  Objective: Vital signs in last 24 hours: Temp:  [98.3 F (36.8 C)-99.3 F (37.4 C)] 99.3 F (37.4 C) (10/24 0400) Pulse Rate:  [76-106] 76 (10/24 0300) Resp:  [0-19] 0 (10/24 0300) BP: (87-139)/(51-73) 96/53 (10/24 0300) SpO2:  [98 %-100 %] 100 % (10/24 0803) FiO2 (%):  [35 %] 35 % (10/24 0803) Weight:  [60.1 kg (132 lb 7.9 oz)] 60.1 kg (132 lb 7.9 oz) (10/24 0553) Weight change: 0.8 kg (1 lb 12.2 oz) Last BM Date: 07/25/2017  Intake/Output from previous day: 10/23 0701 - 10/24 0700 In: 2166 [I.V.:621.8; PP/IR:5188.4; IV Piggyback:260] Out: 1000 [Urine:1000]  PHYSICAL EXAM General appearance: Intubated on mechanical ventilation unresponsive Resp: rhonchi bilaterally Cardio: regular rate and rhythm, S1, S2 normal, no murmur, click, rub or gallop GI: soft, non-tender; bowel sounds normal; no masses,  no organomegaly Extremities: extremities normal, atraumatic, no cyanosis or edema Throat is clear.  Lab Results:  Results for orders placed or performed during the hospital encounter of 06/30/2017 (from the past 48 hour(s))  CBC with Differential/Platelet     Status: Abnormal   Collection Time: 07/17/17  5:23 PM  Result Value Ref Range   WBC 8.2 4.0 - 10.5 K/uL   RBC 2.78 (L) 4.22 - 5.81 MIL/uL   Hemoglobin 8.0 (L) 13.0 - 17.0 g/dL   HCT 23.2 (L) 39.0 - 52.0 %   MCV 83.5 78.0 - 100.0 fL   MCH 28.8 26.0 - 34.0 pg   MCHC 34.5 30.0 - 36.0 g/dL   RDW 14.4 11.5 - 15.5 %   Platelets 158 150 - 400 K/uL   Neutrophils Relative % 80 %   Neutro Abs 6.5 1.7 - 7.7 K/uL   Lymphocytes Relative 7 %   Lymphs Abs 0.6 (L) 0.7 - 4.0 K/uL   Monocytes Relative 12 %   Monocytes Absolute 1.0 0.1 - 1.0 K/uL   Eosinophils Relative 1 %   Eosinophils Absolute 0.1 0.0 - 0.7 K/uL   Basophils Relative 0 %   Basophils Absolute 0.0 0.0 - 0.1 K/uL  Creatinine, serum     Status: Abnormal   Collection Time: 07/18/17  4:44 AM  Result Value Ref Range    Creatinine, Ser 1.58 (H) 0.61 - 1.24 mg/dL   GFR calc non Af Amer 46 (L) >60 mL/min   GFR calc Af Amer 53 (L) >60 mL/min    Comment: (NOTE) The eGFR has been calculated using the CKD EPI equation. This calculation has not been validated in all clinical situations. eGFR's persistently <60 mL/min signify possible Chronic Kidney Disease.   CBC with Differential/Platelet     Status: Abnormal   Collection Time: 07/18/17  4:44 AM  Result Value Ref Range   WBC 8.4 4.0 - 10.5 K/uL   RBC 2.68 (L) 4.22 - 5.81 MIL/uL   Hemoglobin 7.6 (L) 13.0 - 17.0 g/dL   HCT 22.7 (L) 39.0 - 52.0 %   MCV 84.7 78.0 - 100.0 fL   MCH 28.4 26.0 - 34.0 pg   MCHC 33.5 30.0 - 36.0 g/dL   RDW 14.6 11.5 - 15.5 %   Platelets 177 150 - 400 K/uL   Neutrophils Relative % 77 %   Neutro Abs 6.4 1.7 - 7.7 K/uL   Lymphocytes Relative 8 %   Lymphs Abs 0.7 0.7 - 4.0 K/uL   Monocytes Relative 14 %   Monocytes Absolute 1.2 (H) 0.1 - 1.0 K/uL   Eosinophils Relative 1 %  Eosinophils Absolute 0.1 0.0 - 0.7 K/uL   Basophils Relative 0 %   Basophils Absolute 0.0 0.0 - 0.1 K/uL  CBC with Differential/Platelet     Status: Abnormal   Collection Time: 07/18/17  4:37 PM  Result Value Ref Range   WBC 8.6 4.0 - 10.5 K/uL   RBC 2.72 (L) 4.22 - 5.81 MIL/uL   Hemoglobin 7.8 (L) 13.0 - 17.0 g/dL   HCT 23.3 (L) 39.0 - 52.0 %   MCV 85.7 78.0 - 100.0 fL   MCH 28.7 26.0 - 34.0 pg   MCHC 33.5 30.0 - 36.0 g/dL   RDW 14.6 11.5 - 15.5 %   Platelets 176 150 - 400 K/uL   Neutrophils Relative % 76 %   Neutro Abs 6.6 1.7 - 7.7 K/uL   Lymphocytes Relative 7 %   Lymphs Abs 0.6 (L) 0.7 - 4.0 K/uL   Monocytes Relative 15 %   Monocytes Absolute 1.3 (H) 0.1 - 1.0 K/uL   Eosinophils Relative 2 %   Eosinophils Absolute 0.1 0.0 - 0.7 K/uL   Basophils Relative 0 %   Basophils Absolute 0.0 0.0 - 0.1 K/uL  Basic metabolic panel     Status: Abnormal   Collection Time: 07/19/17  5:16 AM  Result Value Ref Range   Sodium 152 (H) 135 - 145 mmol/L    Potassium 3.8 3.5 - 5.1 mmol/L    Comment: DELTA CHECK NOTED   Chloride 120 (H) 101 - 111 mmol/L   CO2 26 22 - 32 mmol/L   Glucose, Bld 170 (H) 65 - 99 mg/dL   BUN 35 (H) 6 - 20 mg/dL   Creatinine, Ser 1.33 (H) 0.61 - 1.24 mg/dL   Calcium 7.7 (L) 8.9 - 10.3 mg/dL   GFR calc non Af Amer 57 (L) >60 mL/min   GFR calc Af Amer >60 >60 mL/min    Comment: (NOTE) The eGFR has been calculated using the CKD EPI equation. This calculation has not been validated in all clinical situations. eGFR's persistently <60 mL/min signify possible Chronic Kidney Disease.    Anion gap 6 5 - 15  CBC with Differential/Platelet     Status: Abnormal   Collection Time: 07/19/17  5:16 AM  Result Value Ref Range   WBC 8.9 4.0 - 10.5 K/uL   RBC 2.62 (L) 4.22 - 5.81 MIL/uL   Hemoglobin 7.6 (L) 13.0 - 17.0 g/dL   HCT 22.3 (L) 39.0 - 52.0 %   MCV 85.1 78.0 - 100.0 fL   MCH 29.0 26.0 - 34.0 pg   MCHC 34.1 30.0 - 36.0 g/dL   RDW 14.6 11.5 - 15.5 %   Platelets 200 150 - 400 K/uL   Neutrophils Relative % 79 %   Neutro Abs 7.0 1.7 - 7.7 K/uL   Lymphocytes Relative 8 %   Lymphs Abs 0.7 0.7 - 4.0 K/uL   Monocytes Relative 11 %   Monocytes Absolute 1.0 0.1 - 1.0 K/uL   Eosinophils Relative 2 %   Eosinophils Absolute 0.2 0.0 - 0.7 K/uL   Basophils Relative 0 %   Basophils Absolute 0.0 0.0 - 0.1 K/uL    ABGS No results for input(s): PHART, PO2ART, TCO2, HCO3 in the last 72 hours.  Invalid input(s): PCO2 CULTURES Recent Results (from the past 240 hour(s))  Urine culture     Status: Abnormal   Collection Time: 06/29/2017  4:36 PM  Result Value Ref Range Status   Specimen Description URINE, CLEAN CATCH  Final  Special Requests NONE  Final   Culture (A)  Final    <10,000 COLONIES/mL INSIGNIFICANT GROWTH Performed at [facility], [place] [address], [place], [place] [address]    Report Status 07/13/2017 FINAL  Final  MRSA PCR Screening     Status: None   Collection Time: 07/02/2017 10:05 PM  Result Value  Ref Range Status   MRSA by PCR NEGATIVE NEGATIVE Final    Comment:        The GeneXpert MRSA Assay (FDA approved for NASAL specimens only), is one component of a comprehensive MRSA colonization surveillance program. It is not intended to diagnose MRSA infection nor to guide or monitor treatment for MRSA infections.   Culture, blood (Routine X 2) w Reflex to ID Panel     Status: None   Collection Time: 07/13/17 10:58 PM  Result Value Ref Range Status   Specimen Description NECK  Final   Special Requests   Final    BOTTLES DRAWN AEROBIC AND ANAEROBIC Blood Culture adequate volume   Culture NO GROWTH 5 DAYS  Final   Report Status 07/18/2017 FINAL  Final  Culture, blood (Routine X 2) w Reflex to ID Panel     Status: None   Collection Time: 07/13/17 11:05 PM  Result Value Ref Range Status   Specimen Description BLOOD RIGHT ARM  Final   Special Requests   Final    BOTTLES DRAWN AEROBIC AND ANAEROBIC Blood Culture adequate volume   Culture NO GROWTH 5 DAYS  Final   Report Status 07/18/2017 FINAL  Final  MRSA PCR Screening     Status: Abnormal   Collection Time: 07/14/17  7:39 AM  Result Value Ref Range Status   MRSA by PCR INVALID RESULTS, SPECIMEN SENT FOR CULTURE (A) NEGATIVE Final    Comment: [address] ON [id number]        The GeneXpert MRSA Assay (FDA approved for NASAL specimens only), is one component of a comprehensive MRSA colonization surveillance program. It is not intended to diagnose MRSA infection nor to guide or monitor treatment for MRSA infections.   MRSA culture     Status: None   Collection Time: 07/14/17  7:39 AM  Result Value Ref Range Status   Specimen Description NOSE  Final   Special Requests NONE  Final   Culture   Final    NO MRSA DETECTED Performed at [facility], [address] [place],  [address]    Report Status 07/15/2017 FINAL  Final   Studies/Results: No results found.  Medications:  Prior to  Admission:  Prescriptions Prior to Admission  Medication Sig Dispense Refill Last Dose  . acetaminophen (TYLENOL) 325 MG tablet Take 650 mg by mouth 3 (three) times daily.   07/20/2017 at Unknown time  . acetaminophen (TYLENOL) 500 MG tablet Take 1,000 mg by mouth every 6 (six) hours as needed for mild pain or moderate pain.   unknown  . ALPRAZolam (XANAX) 0.25 MG tablet Take 0.25 mg by mouth 2 (two) times daily.    07/02/2017 at Unknown time  . aspirin EC 81 MG tablet Take 81 mg by mouth daily.   07/12/2017 at Unknown time  . baclofen (LIORESAL) 10 MG tablet Take 10 mg by mouth 3 (three) times daily.   07/17/2017 at Unknown time  . busPIRone (BUSPAR) 7.5 MG tablet Take 7.5 mg by mouth 2 (two) times daily.   07/09/2017 at Unknown time  . cefTRIAXone (ROCEPHIN)  1 g injection Inject 2 g into the muscle once.   07/10/2017 at Unknown time  . chlorhexidine (PERIDEX) 0.12 % solution Use as directed 15 mLs in the mouth or throat 2 (two) times daily.   06/27/2017 at Unknown time  . cholecalciferol (VITAMIN D) 1000 units tablet Take 1,000 Units by mouth daily.   07/05/2017 at Unknown time  . cloNIDine (CATAPRES) 0.2 MG tablet Take 0.2 mg by mouth once.   06/29/2017 at Unknown time  . gabapentin (NEURONTIN) 600 MG tablet Take 600 mg by mouth at bedtime.    07/10/2017 at Unknown time  . HYDROcodone-acetaminophen (NORCO) 10-325 MG tablet Take 1 tablet by mouth 3 (three) times daily.    07/18/2017 at Unknown time  . lisinopril (PRINIVIL,ZESTRIL) 20 MG tablet Take 20 mg by mouth daily.   07/23/2017 at Unknown time  . Melatonin 3 MG TABS Take 3 mg by mouth at bedtime.   07/10/2017 at Unknown time  . Multiple Vitamin (MULTIVITAMIN WITH MINERALS) TABS tablet Take 1 tablet by mouth daily.   07/06/2017 at Unknown time  . OXYGEN Inhale into the lungs daily.   07/01/2017 at Unknown time   Scheduled: . aspirin  325 mg Oral Daily  . chlorhexidine gluconate (MEDLINE KIT)  15 mL Mouth Rinse BID  . Chlorhexidine  Gluconate Cloth  6 each Topical Daily  . docusate sodium  100 mg Oral BID  . mouth rinse  15 mL Mouth Rinse QID  . mupirocin ointment  1 application Nasal BID  . pantoprazole (PROTONIX) IV  40 mg Intravenous Q12H  . sodium chloride flush  10-40 mL Intracatheter Q12H   Continuous: . [START ON 07/20/2017] ceFEPime (MAXIPIME) IV    . feeding supplement (VITAL AF 1.2 CAL) 1,000 mL (07/18/17 1712)  . fentaNYL infusion INTRAVENOUS 10 mcg/hr (07/19/17 0541)  . levETIRAcetam Stopped (07/18/17 2138)  . norepinephrine (LEVOPHED) Adult infusion Stopped (07/16/17 0500)   QQV:ZDGLOVFIEPPIR **OR** acetaminophen, bisacodyl, fentaNYL, LORazepam, polyethylene glycol, polyvinyl alcohol, sodium chloride flush, sodium phosphate  Assesment: He was admitted with urinary tract infection. He had near cardiopulmonary arrest and was intubated. He had multi-system failure with renal failure non-STEMI respiratory failure acute liver injury and appears to have had strokes. His baseline was with intellectual disability and even that had gotten worse prior to admission. He has not been able to wean using standard weaning techniques because he has poor respiratory effort. He has been seen by cardiology who feel that his prognosis is very poor and that in-hospital mortality likelihood is high. He has been seen by neurology who feel that his prognosis for returning to any sort of functional status is very poor. As far as his respiratory status is concerned he's not making a good respiratory effort so his chance of recovery is very poor Active Problems:   Intellectual disability   Altered mental status   Acute lower UTI   Dehydration   Contractures of both knees   Debility   Bedbound   Essential hypertension   Respiratory failure (HCC)   Palliative care encounter   Goals of care, counseling/discussion   Apnea   NSTEMI (non-ST elevated myocardial infarction) ([place])    Plan: I would be in favor of de-escalating care  taking him off the ventilator with no intention of reintubating.     LOS: 5 days   Lorrain Rivers L 07/19/2017, 8:29 AM

## 2017-07-19 NOTE — Progress Notes (Signed)
PROGRESS NOTE    Reginald [name]  OYD:[id number] DOB: 1955/12/24 DOA: 06/29/2017 PCP: Patient, [name] Per    Brief Narrative:  61-year-old male who presented with lethargy, hypotension and decreased mobility from the skilled nursing facility. Patient is known to have hypertension, dyslipidemia, severe cerebral palsy with muscle contractures. Patient was admitted to the hospital working diagnosis of sepsis due to urinary tract infection, he developed a near cardiac arrest 10/18, and was placed on noninvasive mechanical ventilation and vasopressors.10/19 he was noted to have seizures.   Patient continued to be unresponsive, on noninvasive mechanical ventilation he  was determined to be very poor prognosis, CODE STATUS has been changed to DO NOT RESUSCITATE. He has been unable to be liberated from mechanical ventilation.    Assessment & Plan:   Active Problems:   Intellectual disability   Altered mental status   Acute lower UTI   Dehydration   Contractures of both knees   Debility   Bedbound   Essential hypertension   Respiratory failure (HCC)   Palliative care encounter   Goals of care, counseling/discussion   Apnea   NSTEMI (non-ST elevated myocardial infarction) ([place])   1. Multifactorial acute encephalopathy. Patient continue to be unresponsive, not following commands, only moving spontaneously. Positive corneal and oculocephalic reflexes. Very poor prognosis. Will continue feeding as tolerated.   2. Bilateral large vessels cerebellar infarcts. Continue supportive medical care.   3. Respiratory failure due to CNS dysfunction. PRVC tV 500 with peep 3 and Fi02 30%. Patient matching respiratory rate set on the ventilator, will continue fentanyl drip to allow synchrony with the mechanical ventilation. Very poor prognosis.   3. Seizures. Continue anti-seizure therapy, last EEG with no active seizures. Will continue neuro checks and aspiration precautions.   4. Urine tract infection.  Patient completed course of antibiotic therapy.   5. HTN. Will continue blood pressure monitoring  6. Anemia. Hb and Hct stable with no signs of bleeding  7. Severe cerebral palsy. Very poor prognosis, will continue supportive medical therapy.      DVT prophylaxis:enoxaparin  Code Status:  dnr Family Communication: no family at the bedside Disposition Plan: home   Consultants:   Neurology  Procedures:     Antimicrobials:       Subjective: Patient has been not responsive, not following commands, moving extremities spontaneously, tolerating tube feedings well.   Objective: Vitals:   07/19/17 0300 07/19/17 0400 07/19/17 0440 07/19/17 0553  BP: (!) 96/53     Pulse: 76     Resp: (!) 0     Temp:  99.3 F (37.4 C)    TempSrc:  Axillary    SpO2: 100%  100%   Weight:    60.1 kg (132 lb 7.9 oz)  Height:        Intake/Output Summary (Last 24 hours) at 07/19/17 0806 Last data filed at 07/19/17 0548  Gross per 24 hour  Intake             2031 ml  Output             1000 ml  Net             1031 ml   Filed Weights   07/15/17 0400 07/18/17 0500 07/19/17 0553  Weight: 57.6 kg (126 lb 15.8 oz) 59.3 kg (130 lb 11.7 oz) 60.1 kg (132 lb 7.9 oz)    Examination:   General: deconditioned Neurology: positive oculocephalic and corneal reflexes, on IV fentanyl 10 mcg/hr E ENT: mild  pallor, no icterus, oral mucosa moist, conjunctival edema on the right.  Cardiovascular: No JVD. S1-S2 present, rhythmic, no gallops, rubs, or murmurs. No lower extremity edema. Pulmonary: decreased breath sounds bilaterally, adequate air movement, no wheezing, rhonchi or rales. Gastrointestinal. Abdomen flat, no organomegaly, non tender, no rebound or guarding Skin. No rashes Musculoskeletal: no joint deformities     Data Reviewed: I have personally reviewed following labs and imaging studies  CBC:  Recent Labs Lab 07/17/17 0532 07/17/17 1723 07/18/17 0444 07/18/17 1637  07/19/17 0516  WBC 8.4 8.2 8.4 8.6 8.9  NEUTROABS 6.5 6.5 6.4 6.6 7.0  HGB 8.5* 8.0* 7.6* 7.8* 7.6*  HCT 24.8* 23.2* 22.7* 23.3* 22.3*  MCV 83.2 83.5 84.7 85.7 85.1  PLT 160 158 177 176 458   Basic Metabolic Panel:  Recent Labs Lab 07/14/17 1834 07/15/17 0425 07/16/17 0438 07/17/17 0532 07/18/17 0444 07/19/17 0516  NA 127* 135 143 146*  --  152*  K 3.6 3.4* 3.1* 3.1*  --  3.8  CL 89* 96* 105 110  --  120*  CO2 '28 28 27 24  ' --  26  GLUCOSE 126* 98 73 69  --  170*  BUN 39* 41* 41* 39*  --  35*  CREATININE 2.37* 2.15* 1.72* 1.61* 1.58* 1.33*  CALCIUM 7.8* 7.6* 7.8* 7.9*  --  7.7*   GFR: Estimated Creatinine Clearance: 50.2 mL/min (A) (by C-G formula based on SCr of 1.33 mg/dL (H)). Liver Function Tests:  Recent Labs Lab 07/13/17 2305 07/14/17 0912  AST 456* 2,036*  ALT 783* 2,552*  ALKPHOS 163* 152*  BILITOT 0.6 0.6  PROT 6.2* 5.7*  ALBUMIN 2.8* 2.6*   No results for input(s): LIPASE, AMYLASE in the last 168 hours. No results for input(s): AMMONIA in the last 168 hours. Coagulation Profile: No results for input(s): INR, PROTIME in the last 168 hours. Cardiac Enzymes:  Recent Labs Lab 07/14/17 0912 07/15/17 0425  TROPONINI 9.58* 5.76*   BNP (last 3 results) No results for input(s): PROBNP in the last 8760 hours. HbA1C: No results for input(s): HGBA1C in the last 72 hours. CBG:  Recent Labs Lab 07/13/17 2123  GLUCAP 121*   Lipid Profile: No results for input(s): CHOL, HDL, LDLCALC, TRIG, CHOLHDL, LDLDIRECT in the last 72 hours. Thyroid Function Tests: No results for input(s): TSH, T4TOTAL, FREET4, T3FREE, THYROIDAB in the last 72 hours. Anemia Panel: No results for input(s): VITAMINB12, FOLATE, FERRITIN, TIBC, IRON, RETICCTPCT in the last 72 hours.    Radiology Studies: I have reviewed all of the imaging during this hospital visit personally     Scheduled Meds: . aspirin  325 mg Oral Daily  . chlorhexidine gluconate (MEDLINE KIT)  15 mL  Mouth Rinse BID  . Chlorhexidine Gluconate Cloth  6 each Topical Daily  . docusate sodium  100 mg Oral BID  . mouth rinse  15 mL Mouth Rinse QID  . mupirocin ointment  1 application Nasal BID  . pantoprazole (PROTONIX) IV  40 mg Intravenous Q12H  . sodium chloride flush  10-40 mL Intracatheter Q12H   Continuous Infusions: . ceFEPime (MAXIPIME) IV 1 g (07/19/17 0536)  . feeding supplement (VITAL AF 1.2 CAL) 1,000 mL (07/18/17 1712)  . fentaNYL infusion INTRAVENOUS 10 mcg/hr (07/19/17 0541)  . levETIRAcetam Stopped (07/18/17 2138)  . norepinephrine (LEVOPHED) Adult infusion Stopped (07/16/17 0500)     LOS: 5 days        Mauricio Gerome Apley, MD [facility] Pager [phone]

## 2017-07-19 NOTE — Progress Notes (Signed)
Daily Progress Note   Patient Name: Reginald [name]       Date: 07/19/2017 DOB: 1956/09/22  Age: 61 y.o. MRN#: [id number] Attending Physician: Tawni Millers Primary Care Physician: Patient, [name] Per Admit Date: 07/05/2017  Reason for Consultation/Follow-up: Establishing goals of care and Withdrawal of life-sustaining treatment  Subjective: Reginald [name] is lying quietly in bed. He is intubated/ventilated, sedated. There is no family at bedside at this time. Although Reginald [name] has been able to wean from the ventilator somewhat, he is still only responsive to painful stimulus.  The consensus of the medical team is for comfort and dignity at end-of-life, compassionate extubation without reading intubation, let nature take its course. DSS social worker, as of 10/24, states that she is working to [employer], and will notify the medical team when the court has approved for Reginald [name] to have compassionate extubation, comfort and dignity at end-of-life.   Length of Stay: 5  Current Medications: Scheduled Meds:  . aspirin  325 mg Oral Daily  . chlorhexidine gluconate (MEDLINE KIT)  15 mL Mouth Rinse BID  . Chlorhexidine Gluconate Cloth  6 each Topical Daily  . docusate sodium  100 mg Oral BID  . free water  250 mL Per Tube Q6H  . mouth rinse  15 mL Mouth Rinse QID  . pantoprazole (PROTONIX) IV  40 mg Intravenous Q12H  . sodium chloride flush  10-40 mL Intracatheter Q12H    Continuous Infusions: . feeding supplement (VITAL AF 1.2 CAL) 1,000 mL (07/18/17 1712)  . fentaNYL infusion INTRAVENOUS 10 mcg/hr (07/19/17 0541)  . levETIRAcetam Stopped (07/19/17 1054)  . norepinephrine (LEVOPHED) Adult infusion Stopped (07/16/17 0500)    PRN Meds: acetaminophen **OR** acetaminophen,  bisacodyl, fentaNYL, LORazepam, polyethylene glycol, polyvinyl alcohol, sodium chloride flush, sodium phosphate  Physical Exam  Constitutional: No distress.  Intubated/ventilated, sedated  HENT:  Head: Atraumatic.  Cardiovascular: Normal rate.   Pulmonary/Chest:  Intubated/ventilated, sedated  Abdominal: Soft. He exhibits no distension.  Musculoskeletal: He exhibits no edema.  Neurological:  Intubated/ventilated, sedated, known dementia  Skin: Skin is warm and dry.  Nursing note and vitals reviewed.           Vital Signs: BP (!) 96/54   Pulse 79   Temp 99.8 F (37.7 C) (Axillary)   Resp 12   Ht  '5\' 5"'  (1.651 m)   Wt 60.1 kg (132 lb 7.9 oz)   SpO2 100%   BMI 22.05 kg/m  SpO2: SpO2: 100 % O2 Device: O2 Device: Ventilator O2 Flow Rate:    Intake/output summary:  Intake/Output Summary (Last 24 hours) at 07/19/17 1255 Last data filed at 07/19/17 0548  Gross per 24 hour  Intake             1336 ml  Output             1000 ml  Net              336 ml   LBM: Last BM Date: 07/20/2017 Baseline Weight: Weight: 54.4 kg (120 lb) Most recent weight: Weight: 60.1 kg (132 lb 7.9 oz)       Palliative Assessment/Data:    Flowsheet Rows     Most Recent Value  Intake Tab  Referral Department  Hospitalist  Unit at Time of Referral  ICU  Palliative Care Primary Diagnosis  Cardiac  Date Notified  07/14/17  Palliative Care Type  New Palliative care  Reason for referral  Clarify Goals of Care, End of Life Care Assistance  Date of Admission  07/08/2017  Date first seen by Palliative Care  07/14/17  # of days Palliative referral response time  0 Day(s)  # of days IP prior to Palliative referral  3  Clinical Assessment  Palliative Performance Scale Score  10%  Pain Max last 24 hours  Not able to report  Pain Min Last 24 hours  Not able to report  Dyspnea Max Last 24 Hours  Not able to report  Dyspnea Min Last 24 hours  Not able to report  Psychosocial & Spiritual Assessment    Palliative Care Outcomes  Patient/Family meeting held?  No [LM for DSS SW ]      Patient Active Problem List   Diagnosis Date Noted  . Apnea   . NSTEMI (non-ST elevated myocardial infarction) ([place])   . Respiratory failure ([place])   . Palliative care encounter   . Goals of care, counseling/discussion   . Intellectual disability 07/10/2017  . Altered mental status 07/26/2017  . Acute lower UTI 07/06/2017  . Dehydration 07/10/2017  . 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 Debility 07/19/2017  . Bedbound 06/29/2017  . Essential hypertension     Palliative Care Assessment & Plan   Patient Profile: 61 y.o. male  with past medical history of moderate intellectual disabilities, hypertension, dysphagia, muscle contracture since being mostly bedbound/wheelchair-bound since December 2017, anxiety admitted on 06/29/2017 with altered mental status UTI, multisystem organ failure after respiratory arrest.   Assessment: altered mental status UTI, multisystem organ failure after respiratory arrest; the consensus of the medical team is for comfort and dignity at end-of-life, compassionate extubation without reading intubation, let nature take its course. DSS social worker, as of 10/24, states that she is working to [employer].  Recommendations/Plan: the consensus of the medical team is for comfort and dignity at end-of-life, compassionate extubation without reading intubation, let nature take its course. DSS social worker, as of 10/24, states that she is working to [employer] [facility].  Goals of Care and Additional Recommendations:  Limitations on Scope of Treatment: No CPR, working toward compassionate extubation  Code Status:    Code Status Orders        Start     Ordered   07/14/17 1204  Do not attempt resuscitation (DNR)  Continuous    Question Answer  Comment  In the event of cardiac or respiratory ARREST Do not call a "code blue"   In the event of cardiac or respiratory  ARREST Do not perform Intubation, CPR, defibrillation or ACLS   In the event of cardiac or respiratory ARREST Use medication by any route, position, wound care, and other measures to relive pain and suffering. May use oxygen, suction and manual treatment of airway obstruction as needed for comfort.      07/14/17 1203    Code Status History    Date Active Date Inactive Code Status Order ID Comments User Context    10:10 PM 07/14/2017 12:03 PM Full Code [id number]  Roney Jaffe, MD Inpatient       Prognosis:   Hours - Days, likely once extubated.  Discharge Planning:  Anticipated Hospital Death  Care plan was discussed with nursing staff, case management, social worker, [place] social worker, and Dr. Cathlean Sauer.   Thank you for allowing the Palliative Medicine Team to assist in the care of this patient.   Time In: 1220 Time Out: 1255 Total Time 35 minutes Prolonged Time Billed  no       Greater than 50%  of this time was spent counseling and coordinating care related to the above assessment and plan.  Drue Novel, NP  Please contact Palliative Medicine Team phone at [phone] for questions and concerns.

## 2017-07-19 NOTE — Plan of Care (Signed)
Problem: Skin Integrity: Goal: Risk for impaired skin integrity will decrease Outcome: Progressing Patient at risk for skin breakdown. On Q2h rotation and in low air loss bed.

## 2017-07-20 DIAGNOSIS — R402434 Glasgow coma scale score 3-8, 24 hours or more after hospital admission: Secondary | ICD-10-CM

## 2017-07-20 DIAGNOSIS — E870 Hyperosmolality and hypernatremia: Secondary | ICD-10-CM

## 2017-07-20 DIAGNOSIS — A419 Sepsis, unspecified organism: Principal | ICD-10-CM

## 2017-07-20 LAB — BASIC METABOLIC PANEL
Anion gap: 5 (ref 5–15)
BUN: 42 mg/dL — AB (ref 6–20)
CO2: 26 mmol/L (ref 22–32)
CREATININE: 1.24 mg/dL (ref 0.61–1.24)
Calcium: 7.6 mg/dL — ABNORMAL LOW (ref 8.9–10.3)
Chloride: 121 mmol/L — ABNORMAL HIGH (ref 101–111)
GFR calc Af Amer: >60 mL/min (ref >60)
GFR calc non Af Amer: >60 mL/min (ref >60)
GLUCOSE: 154 mg/dL — AB (ref 65–99)
POTASSIUM: 4 mmol/L (ref 3.5–5.1)
SODIUM: 152 mmol/L — AB (ref 135–145)

## 2017-07-20 MED ORDER — FREE WATER
350.0000 mL | Freq: Four times a day (QID) | Status: DC
  Administered 2017-07-20: 350 mL

## 2017-07-20 MED ORDER — FENTANYL CITRATE (PF) 100 MCG/2ML IJ SOLN
50.0000 ug | INTRAMUSCULAR | Status: DC | PRN
  Administered 2017-07-20: 50 ug via INTRAVENOUS
  Administered 2017-07-20 (×3): 100 ug via INTRAVENOUS
  Filled 2017-07-20 (×4): qty 2

## 2017-07-20 NOTE — Progress Notes (Signed)
He remains intubated and on the ventilator without any ability to get him off. He is on fentanyl and has synchronized with the ventilator and appears comfortable. We are awaiting court order for compassionate extubation.

## 2017-07-20 NOTE — Progress Notes (Signed)
Daily Progress Note   Patient Name: Reginald [name]       Date: 07/20/2017 DOB: 1956/09/19  Age: 61 y.o. MRN#: [id number] Attending Physician: Tawni Millers Primary Care Physician: Patient, [name] Per Admit Date: 07/16/2017  Reason for Consultation/Follow-up: Establishing goals of care, Psychosocial/spiritual support and Withdrawal of life-sustaining treatment  Subjective: [name] is lying quietly in bed. He is intubated/ventilated, and sedated. He does not respond to me in any meaningful way. Niece, [name], is at bedside. All questions answered.  Conference with DSS social worker, [employer]. She brings legal court paperwork that allows for compassionate extubation. Orders written, conference with nursing and respiratory therapy. Conference with hospitalist.  [name] is allowed a compassionate extubation, family at bedside. Symptoms well-managed. Will continue to monitor, but anticipate Hospital death within hours.  Length of Stay: 6  Current Medications: Scheduled Meds:  . aspirin  325 mg Oral Daily  . chlorhexidine gluconate (MEDLINE KIT)  15 mL Mouth Rinse BID  . Chlorhexidine Gluconate Cloth  6 each Topical Daily  . docusate sodium  100 mg Oral BID  . free water  350 mL Per Tube Q6H  . mouth rinse  15 mL Mouth Rinse QID  . pantoprazole (PROTONIX) IV  40 mg Intravenous Q12H  . sodium chloride flush  10-40 mL Intracatheter Q12H    Continuous Infusions: . feeding supplement (VITAL AF 1.2 CAL) 1,000 mL (07/20/17 0600)  . fentaNYL infusion INTRAVENOUS 100 mcg/hr (07/20/17 0600)  . levETIRAcetam Stopped (07/20/17 1028)  . norepinephrine (LEVOPHED) Adult infusion Stopped (07/16/17 0500)    PRN Meds: acetaminophen **OR** acetaminophen, bisacodyl, fentaNYL,  LORazepam, polyethylene glycol, polyvinyl alcohol, sodium chloride flush, sodium phosphate  Physical Exam  Constitutional: No distress.  Intubated/ventilated, sedated  HENT:  Head: Atraumatic.  Cardiovascular: Normal rate.   Pulmonary/Chest:  Intubated/ventilated, sedated  Abdominal: Soft. He exhibits no distension.  Musculoskeletal: He exhibits deformity.  Contractures  Neurological:  Intubated/ventilated, sedated  Skin: Skin is warm and dry.  Nursing note and vitals reviewed.           Vital Signs: BP (!) 106/51   Pulse 78   Temp (!) 102.6 F (39.2 C) (Oral)   Resp 12   Ht '5\' 5"'  (1.651 m)   Wt 60.1 kg (132 lb 7.9 oz)   SpO2 100%  BMI 22.05 kg/m  SpO2: SpO2: 100 % O2 Device: O2 Device: Ventilator O2 Flow Rate:    Intake/output summary:  Intake/Output Summary (Last 24 hours) at 07/20/17 1246 Last data filed at 07/20/17 0600  Gross per 24 hour  Intake          1612.95 ml  Output             1300 ml  Net           312.95 ml   LBM: Last BM Date: 07/20/17 Baseline Weight: Weight: 54.4 kg (120 lb) Most recent weight: Weight: 60.1 kg (132 lb 7.9 oz)       Palliative Assessment/Data:    Flowsheet Rows     Most Recent Value  Intake Tab  Referral Department  Hospitalist  Unit at Time of Referral  ICU  Palliative Care Primary Diagnosis  Cardiac  Date Notified  07/14/17  Palliative Care Type  New Palliative care  Reason for referral  Clarify Goals of Care, End of Life Care Assistance  Date of Admission  06/29/2017  Date first seen by Palliative Care  07/14/17  # of days Palliative referral response time  0 Day(s)  # of days IP prior to Palliative referral  3  Clinical Assessment  Palliative Performance Scale Score  10%  Pain Max last 24 hours  Not able to report  Pain Min Last 24 hours  Not able to report  Dyspnea Max Last 24 Hours  Not able to report  Dyspnea Min Last 24 hours  Not able to report  Psychosocial & Spiritual Assessment  Palliative Care Outcomes   Patient/Family meeting held?  No [LM for DSS SW ]      Patient Active Problem List   Diagnosis Date Noted  . End of life care   . Apnea   . NSTEMI (non-ST elevated myocardial infarction) ([place])   . Respiratory failure ([place])   . Palliative care encounter   . Goals of care, counseling/discussion   . Intellectual disability 07/14/2017  . Altered mental status 07/15/2017  . Acute lower UTI 07/17/2017  . Dehydration 07/14/2017  . Contractures of both knees 07/12/2017  . Debility 07/15/2017  . Bedbound 07/20/2017  . Essential hypertension 07/23/2017    Palliative Care Assessment & Plan   Patient Profile: 61 y.o.malewith past medical history of moderate intellectual disabilities, hypertension, dysphagia, muscle contracture since being mostly bedbound/wheelchair-bound since December 2017, anxietyadmitted on 10/16/2018with altered mental status UTI, multisystem organ failure after respiratory arrest.   Assessment: altered mental status UTI, multisystem organ failure after respiratory arrest; the consensus of the medical team is for comfort and dignity at end-of-life, compassionate extubation without reading intubation, let nature take its course. DSS social worker, as of 10/24, states that she is working to [employer].  10/25 legal paperwork allowing for compassionate extubation received, file for chart is in possession.  Recommendations/Plan: the consensus of the medical team is for comfort and dignity at end-of-life, compassionate extubation without reading intubation, let nature take its course. DSS social worker, present, compassionate extubation planned for 10/25 at 1400. DSS social worker working with family.  Goals of Care and Additional Recommendations:  Limitations on Scope of Treatment: Full Comfort Care  Code Status:    Code Status Orders        Start     Ordered   07/14/17 2409  Do not attempt resuscitation (DNR)  Continuous    Question Answer Comment  In  the event of cardiac or  respiratory ARREST Do not call a "code blue"   In the event of cardiac or respiratory ARREST Do not perform Intubation, CPR, defibrillation or ACLS   In the event of cardiac or respiratory ARREST Use medication by any route, position, wound care, and other measures to relive pain and suffering. May use oxygen, suction and manual treatment of airway obstruction as needed for comfort.      07/14/17 1203    Code Status History    Date Active Date Inactive Code Status Order ID Comments User Context   07/02/2017 10:10 PM 07/14/2017 12:03 PM Full Code [id number]  Roney Jaffe, MD Inpatient       Prognosis:   Hours - Days  Discharge Planning:  Anticipated Hospital Death  Care plan was discussed with nursing staff, case manager, social worker, and Dr. Cathlean Sauer.  Thank you for allowing the Palliative Medicine Team to assist in the care of this patient.   Time In: 1120 1440 Time Out: 1140 1510 Total Time 20+30= 50 minutes Prolonged Time Billed  yes       Greater than 50%  of this time was spent counseling and coordinating care related to the above assessment and plan.  Drue Novel, NP  Please contact Palliative Medicine Team phone at [phone] for questions and concerns.

## 2017-07-20 NOTE — Progress Notes (Addendum)
PROGRESS NOTE    Reginald [name]  UQJ:[id number] DOB: 1956/04/12 DOA: 07/20/2017 PCP: Patient, [name] Per    Brief Narrative:  61 year old male who presented with lethargy, hypotension and decreased mobility from the skilled nursing facility. Patient is known to have hypertension, dyslipidemia, severe cerebral palsy with muscle contractures. Patient was admitted to the hospital working diagnosis of sepsis due to urinary tract infection, he developed a near cardiac arrest 10/18, and was placed on noninvasive mechanical ventilation and vasopressors.10/19 he was noted to have seizures. (sepsis present on admission)  Patient continued to be unresponsive, on noninvasive mechanical ventilation he  was determined to be very poor prognosis, CODE STATUS has been changed to DO NOT RESUSCITATE. He has been unable to be liberated from mechanical ventilation.    Patient very poor prognosis, dependent on mechanical ventilation, in progress legal documentation, to proceed with extubation and full comfort measures, following patient and patient's family advanced directives.   Assessment & Plan:   Active Problems:   Intellectual disability   Altered mental status   Acute lower UTI   Dehydration   Contractures of both knees   Debility   Bedbound   Essential hypertension   Respiratory failure ([facility])   Palliative care encounter   Goals of care, counseling/discussion   Apnea   NSTEMI (non-ST elevated myocardial infarction) ([place])   End of life care   1. Multifactorial acute encephalopathy in the setting of severe cerebral palsy.  Unresponsive, continue not following commands. Attempts to withdrawal from pain, but no eye or verbal response with glasgow coma scale down to 6. Very poor prognosis. Patient on fentanyl infusion to tolerate mechanical ventilation.   2. Bilateral large vessels cerebellar infarcts. Very poor prognosis will continue supportive medical care.   3. Respiratory failure due to CNS  dysfunction. Adequate synchrony with mechanical ventilator on PRVC tV 500 with peep 3 and Fi02 30%. When changed to spontaneous mode of ventilation, patient develops apnea. Will keep fentanyl infusion for now.   3. Seizures. On anti-seizure therapy, keppra with good toleration as needed lorazepam.   4. Urine tract infection with sepsis present on admission. Resolved.   5. HTN. Blood pressure systolic in the 389'H.   6. Anemia multifactorial. No signs of active bleeding, very poor prognosis, no PRBC transfusion, unless less than 7.   7. AKI with hypernatremia. Continue tube feedings, for now. Serum cr at 1,24 from 1,33, Stable Na at 152, will increase free water flushed to 300 tid, and plan to check renal panel in am.        DVT prophylaxis:enoxaparin  Code Status:  dnr Family Communication: no family at the bedside Disposition Plan: home   Consultants:   Neurology  Procedures:     Antimicrobials:       Subjective Patient continue to be on the ventilator and on fentanyl drip, tolerating tube feedings, not interactive.   Objective: Vitals:   07/20/17 0400 07/20/17 0500 07/20/17 0600 07/20/17 0800  BP: (!) 126/58 (!) 109/54 111/61   Pulse: 92 80 81   Resp: '12 12 12   ' Temp: 99.2 F (37.3 C)   99.5 F (37.5 C)  TempSrc: Axillary   Axillary  SpO2: 100% 100% 100%   Weight:      Height:        Intake/Output Summary (Last 24 hours) at 07/20/17 0825 Last data filed at 07/20/17 0600  Gross per 24 hour  Intake          1612.95 ml  Output             1300 ml  Net           312.95 ml   Filed Weights   07/15/17 0400 07/18/17 0500 07/19/17 0553  Weight: 57.6 kg (126 lb 15.8 oz) 59.3 kg (130 lb 11.7 oz) 60.1 kg (132 lb 7.9 oz)    Examination:   General: deconditioned, not responsive.  Neurology: not responsive, tries to withdrawal from pain, but very limited mobility. No eye opening.  E ENT: mild pallor, no icterus, oral mucosa moist Cardiovascular: No  JVD. S1-S2 present, rhythmic, no gallops, rubs, or murmurs. No lower extremity edema. Pulmonary: vesicular breath sounds bilaterally, adequate air movement, no wheezing or rales. Positive rhonchi.  Gastrointestinal. Abdomen flat, no organomegaly, non tender, no rebound or guarding Skin. No rashes Musculoskeletal: no joint deformities     Data Reviewed: I have personally reviewed following labs and imaging studies  CBC:  Recent Labs Lab 07/17/17 0532 07/17/17 1723 07/18/17 0444 07/18/17 1637 07/19/17 0516  WBC 8.4 8.2 8.4 8.6 8.9  NEUTROABS 6.5 6.5 6.4 6.6 7.0  HGB 8.5* 8.0* 7.6* 7.8* 7.6*  HCT 24.8* 23.2* 22.7* 23.3* 22.3*  MCV 83.2 83.5 84.7 85.7 85.1  PLT 160 158 177 176 546   Basic Metabolic Panel:  Recent Labs Lab 07/15/17 0425 07/16/17 0438 07/17/17 0532 07/18/17 0444 07/19/17 0516 07/20/17 0530  NA 135 143 146*  --  152* 152*  K 3.4* 3.1* 3.1*  --  3.8 4.0  CL 96* 105 110  --  120* 121*  CO2 '28 27 24  ' --  26 26  GLUCOSE 98 73 69  --  170* 154*  BUN 41* 41* 39*  --  35* 42*  CREATININE 2.15* 1.72* 1.61* 1.58* 1.33* 1.24  CALCIUM 7.6* 7.8* 7.9*  --  7.7* 7.6*   GFR: Estimated Creatinine Clearance: 53.9 mL/min (by C-G formula based on SCr of 1.24 mg/dL). Liver Function Tests:  Recent Labs Lab 07/13/17 2305 07/14/17 0912  AST 456* 2,036*  ALT 783* 2,552*  ALKPHOS 163* 152*  BILITOT 0.6 0.6  PROT 6.2* 5.7*  ALBUMIN 2.8* 2.6*   No results for input(s): LIPASE, AMYLASE in the last 168 hours. No results for input(s): AMMONIA in the last 168 hours. Coagulation Profile: No results for input(s): INR, PROTIME in the last 168 hours. Cardiac Enzymes:  Recent Labs Lab 07/14/17 0912 07/15/17 0425  TROPONINI 9.58* 5.76*   BNP (last 3 results) No results for input(s): PROBNP in the last 8760 hours. HbA1C: No results for input(s): HGBA1C in the last 72 hours. CBG:  Recent Labs Lab 07/13/17 2123  GLUCAP 121*   Lipid Profile: No results for  input(s): CHOL, HDL, LDLCALC, TRIG, CHOLHDL, LDLDIRECT in the last 72 hours. Thyroid Function Tests: No results for input(s): TSH, T4TOTAL, FREET4, T3FREE, THYROIDAB in the last 72 hours. Anemia Panel: No results for input(s): VITAMINB12, FOLATE, FERRITIN, TIBC, IRON, RETICCTPCT in the last 72 hours.    Radiology Studies: I have reviewed all of the imaging during this hospital visit personally     Scheduled Meds: . aspirin  325 mg Oral Daily  . chlorhexidine gluconate (MEDLINE KIT)  15 mL Mouth Rinse BID  . Chlorhexidine Gluconate Cloth  6 each Topical Daily  . docusate sodium  100 mg Oral BID  . free water  250 mL Per Tube Q6H  . mouth rinse  15 mL Mouth Rinse QID  . pantoprazole (PROTONIX) IV  40 mg  Intravenous Q12H  . sodium chloride flush  10-40 mL Intracatheter Q12H   Continuous Infusions: . feeding supplement (VITAL AF 1.2 CAL) 1,000 mL (07/20/17 0600)  . fentaNYL infusion INTRAVENOUS 100 mcg/hr (07/20/17 0600)  . levETIRAcetam Stopped (07/19/17 2217)  . norepinephrine (LEVOPHED) Adult infusion Stopped (07/16/17 0500)     LOS: 6 days        Shawnte Winton Gerome Apley, MD [facility] Pager [phone]

## 2017-07-20 NOTE — Plan of Care (Signed)
Paperwork allow for compassionate extubation received from DSS SW Melissa Price. Conference with nursing staff, respiratory therapy and attending hospitalist.  Compassionate extubation planned for 10/25 at 1400.

## 2017-07-20 NOTE — Care Management Note (Signed)
Case Management Note  Patient Details  Name: Reginald [name] MRN: [id number] Date of Birth: August 14, 1956   If discussed at Long Length of Stay Meetings, dates discussed:  07/20/2017  Additional Comments:  Reginald [name], Reginald OilerSharley Diane, RN 07/20/2017, 12:14 PM

## 2017-07-20 NOTE — Procedures (Signed)
**Note De-Identified Adonai Helzer Obfuscation** Extubation Procedure Note  Patient Details:   Name: Reginald [name] DOB: [birth year] MRN: [id number]   Airway Documentation:     Evaluation  O2 sats: stable throughout Complications: No apparent complications Patient did tolerate procedure well. Bilateral Breath Sounds: Clear, Diminished   No  Lakasha Mcfall, Megan SalonWendy Cooper 07/20/2017, 2:40 PM

## 2017-07-21 DIAGNOSIS — R0681 Apnea, not elsewhere classified: Secondary | ICD-10-CM

## 2017-07-27 NOTE — Progress Notes (Signed)
Patient expired at 1335.  MD paged to make aware. Wasted 250cc IV fentanyl. L.Covington RN witnessed waste.

## 2017-07-27 NOTE — Death Summary Note (Signed)
Death Summary  Reginald [name] ZOX:[id number] DOB: 1956/06/09 DOA: 2017/08/01  PCP: Patient, [name] Per  Admit date: 01-Aug-2017 Date of Death: 08-11-17 Time of Death:  Notification: Patient, [name] Per notified of death of 11-Aug-2017   History of present illness:  Reginald [name] is a 61 y.o. male with a history of to have hypertension, dyslipidemia, severe cerebral palsy with muscle contractures. Reginald [name] presented with complaint of lethargy, hypotension and decreased mobility from the skilled nursing facility. Reginald [name] did not improve after he developed a near cardiac arrest 10/18, and was placed on noninvasive mechanical ventilation and vasopressors.10/19 he was noted to have seizures. (sepsis present on admission)  Patient continued to be unresponsive, on noninvasive mechanical ventilation he was determined to be very poor prognosis, CODE STATUS was changed to DO NOT RESUSCITATE. He was not unable to be liberated from mechanical ventilation.    Patient very poor prognosis, dependent on mechanical ventilation, per legal guardian request, following patient and patient's family advanced directives patient was extubated and placed on comfort measures.   Final Diagnoses:  1. Respiratory failure due to CNS dysfunction. 2. Multifactorial acute encephalopathy in the setting of severe cerebral palsy.   3. Bilateral large vessels cerebellar infarcts. 4. Seizures 5. Urinary tract infection with sepsis, present on admission 6. Acute kidney injury with hypernatremia 7. Non-ST elevation myocardial infarction      The results of significant diagnostics from this hospitalization (including imaging, microbiology, ancillary and laboratory) are listed below for reference.    Significant Diagnostic Studies: Ct Abdomen Pelvis Wo Contrast  Result Date: 07/14/2017 CLINICAL DATA:  Abnormal liver function tests.  Possible aspiration. EXAM: CT CHEST, ABDOMEN AND PELVIS WITHOUT CONTRAST  TECHNIQUE: Multidetector CT imaging of the chest, abdomen and pelvis was performed following the standard protocol without IV contrast. COMPARISON:  None. FINDINGS: CT CHEST FINDINGS Cardiovascular: Heart is normal size. Aorta is normal caliber. Scattered aortic arch calcifications. Mediastinum/Nodes: No mediastinal, hilar, or axillary adenopathy. Endotracheal tube is in place with the tip in the mid to lower trachea. Lungs/Pleura: Dependent bibasilar opacities bilaterally, right greater than left, favor atelectasis. Trace right pleural effusion. Musculoskeletal: No acute bony abnormality. CT ABDOMEN PELVIS FINDINGS Hepatobiliary: No focal hepatic abnormality. Gallbladder unremarkable. Pancreas: No focal abnormality or ductal dilatation. Spleen: No focal abnormality.  Normal size. Adrenals/Urinary Tract: No adrenal abnormality. No focal renal abnormality. No stones or hydronephrosis. Urinary bladder is unremarkable. Stomach/Bowel: Moderate to large stool burden throughout the colon. No evidence of bowel obstruction. Appendix is normal. Vascular/Lymphatic: No evidence of aneurysm or adenopathy. Reproductive: No visible focal abnormality. Other: No free fluid or free air. Musculoskeletal: No acute bony abnormality. Degenerative facet disease in the lumbar spine. IMPRESSION: Trace right pleural effusion with dependent opacities in both lung bases, favor dependent atelectasis. Large stool burden throughout the colon. Normal appendix. No acute findings in the chest, no acute findings in the abdomen or pelvis. Electronically Signed   By: Charlett Nose M.D.   On: 07/14/2017 10:47   Dg Chest 1 View  Result Date: 07/13/2017 CLINICAL DATA:  Code blue. Enteric tube, central venous catheter, and endotracheal tube placements. History of hypertension and pneumonia. EXAM: CHEST 1 VIEW COMPARISON:  08-01-2017 FINDINGS: Endotracheal tube has been placed with tip measuring 3.6 cm above the carina. An enteric tube is been placed.  The tip is off the field of view but is below the left hemidiaphragm. A left central venous catheter has been placed. The tip is directed horizontally over the mid right  mediastinal region consistent with location at the junction of the brachiocephalic vein and superior vena cava. No pneumothorax. Heart size and pulmonary vascularity are normal for technique. Shallow inspiration. Atelectasis in the lung bases. No airspace disease or consolidation. No pleural effusions. Azygos lobe. Degenerative changes in the spine and shoulders. IMPRESSION: Appliances appear in satisfactory position. Shallow inspiration with atelectasis in the lung bases. No evidence of active pulmonary disease. Electronically Signed   By: Burman Nieves M.D.   On: 07/13/2017 22:22   Ct Head Wo Contrast  Result Date: 07/14/2017 CLINICAL DATA:  New onset seizure. EXAM: CT HEAD WITHOUT CONTRAST TECHNIQUE: Contiguous axial images were obtained from the base of the skull through the vertex without intravenous contrast. COMPARISON:  07/25/2017 FINDINGS: Brain: New areas of hypodensity in the cerebellum bilaterally right greater than left. The appearance is most consistent with acute/subacute infarction. No associated hemorrhage. Mild local mass-effect on the fourth ventricle. Negative for hydrocephalus. Mild hypodensity in the cerebral white matter bilaterally with a chronic appearance. Vascular: Negative for hyperdense vessel Skull: Negative Sinuses/Orbits: Negative Other: None IMPRESSION: New areas of ill-defined hypodensity in the cerebellum bilaterally, right greater than left. This is most consistent with acute/subacute infarction. No hemorrhage. MRI suggested to confirm. Electronically Signed   By: Marlan Palau M.D.   On: 07/14/2017 12:25   Ct Head Wo Contrast  Result Date: 06/27/2017 CLINICAL DATA:  Lethargy, hypertension, low-grade fever, decreased mobility EXAM: CT HEAD WITHOUT CONTRAST TECHNIQUE: Contiguous axial images were  obtained from the base of the skull through the vertex without intravenous contrast. Sagittal and coronal MPR images reconstructed from axial data set. COMPARISON:  11/16/2016 FINDINGS: Brain: Normal ventricular morphology. No midline shift or mass effect. Minimal small vessel chronic ischemic changes of deep cerebral white matter. Otherwise normal appearance of brain parenchyma. No intracranial hemorrhage, mass lesion or evidence of acute infarction. No extra-axial fluid collections. Vascular: Unremarkable Skull: Intact Sinuses/Orbits: Cleared Other: N/A IMPRESSION: Minimal small vessel chronic ischemic changes of deep cerebral white matter. No acute intracranial abnormalities. No significant interval change. Electronically Signed   By: Ulyses Southward M.D.   On: 07/20/2017 17:23   Ct Chest Wo Contrast  Result Date: 07/14/2017 CLINICAL DATA:  Abnormal liver function tests.  Possible aspiration. EXAM: CT CHEST, ABDOMEN AND PELVIS WITHOUT CONTRAST TECHNIQUE: Multidetector CT imaging of the chest, abdomen and pelvis was performed following the standard protocol without IV contrast. COMPARISON:  None. FINDINGS: CT CHEST FINDINGS Cardiovascular: Heart is normal size. Aorta is normal caliber. Scattered aortic arch calcifications. Mediastinum/Nodes: No mediastinal, hilar, or axillary adenopathy. Endotracheal tube is in place with the tip in the mid to lower trachea. Lungs/Pleura: Dependent bibasilar opacities bilaterally, right greater than left, favor atelectasis. Trace right pleural effusion. Musculoskeletal: No acute bony abnormality. CT ABDOMEN PELVIS FINDINGS Hepatobiliary: No focal hepatic abnormality. Gallbladder unremarkable. Pancreas: No focal abnormality or ductal dilatation. Spleen: No focal abnormality.  Normal size. Adrenals/Urinary Tract: No adrenal abnormality. No focal renal abnormality. No stones or hydronephrosis. Urinary bladder is unremarkable. Stomach/Bowel: Moderate to large stool burden throughout  the colon. No evidence of bowel obstruction. Appendix is normal. Vascular/Lymphatic: No evidence of aneurysm or adenopathy. Reproductive: No visible focal abnormality. Other: No free fluid or free air. Musculoskeletal: No acute bony abnormality. Degenerative facet disease in the lumbar spine. IMPRESSION: Trace right pleural effusion with dependent opacities in both lung bases, favor dependent atelectasis. Large stool burden throughout the colon. Normal appendix. No acute findings in the chest, no acute findings in the abdomen  or pelvis. Electronically Signed   By: Charlett Nose M.D.   On: 07/14/2017 10:47   Dg Chest Port 1 View  Result Date: 07/15/2017 CLINICAL DATA:  Respiratory difficulty, follow-up effusion EXAM: PORTABLE CHEST 1 VIEW COMPARISON:  07/14/2017 FINDINGS: Cardiac shadow is stable. Endotracheal tube and orogastric catheter are noted within the stomach. Patient is rotated accentuating the mediastinal markings. Left jugular central line is again seen. The lungs are clear bilaterally no bony abnormality is seen. IMPRESSION: No acute abnormality noted. Tubes and lines as described. Electronically Signed   By: Alcide Clever M.D.   On: 07/15/2017 07:45   Dg Chest Port 1 View  Result Date: 07/14/2017 CLINICAL DATA:  Respiratory failure.  Ventilated patient. EXAM: PORTABLE CHEST 1 VIEW COMPARISON:  Single-view of the chest 07/13/2017. FINDINGS: ET tube and NG tube remain in place in good position. Lungs are clear. No pneumothorax or pleural effusion. Heart size is normal. IMPRESSION: Support apparatus projects in good position.  Lungs are clear. Electronically Signed   By: Drusilla Kanner M.D.   On: 07/14/2017 09:44   Dg Chest Portable 1 View  Result Date: 07/07/2017 CLINICAL DATA:  Low-grade fever, lethargy, hypertension, decreased mobility EXAM: PORTABLE CHEST 1 VIEW COMPARISON:  Portable exam 1623 hours without priors for comparison FINDINGS: Normal heart size, mediastinal contours, and  pulmonary vascularity. Azygos fissure noted. Minimal RIGHT basilar atelectasis. Lungs otherwise clear. No infiltrate, pleural effusion or pneumothorax. Bones unremarkable. IMPRESSION: Minimal RIGHT basilar atelectasis. Electronically Signed   By: Ulyses Southward M.D.   On: 07/03/2017 16:40    Microbiology: Recent Results (from the past 240 hour(s))  Urine culture     Status: Abnormal   Collection Time: 07/10/2017  4:36 PM  Result Value Ref Range Status   Specimen Description URINE, CLEAN CATCH  Final   Special Requests NONE  Final   Culture (A)  Final    <10,000 COLONIES/mL INSIGNIFICANT GROWTH Performed at [facility], [address] [place], [place] [address]    Report Status 07/13/2017 FINAL  Final  MRSA PCR Screening     Status: None   Collection Time: 07/02/2017 10:05 PM  Result Value Ref Range Status   MRSA by PCR NEGATIVE NEGATIVE Final    Comment:        The GeneXpert MRSA Assay (FDA approved for NASAL specimens only), is one component of a comprehensive MRSA colonization surveillance program. It is not intended to diagnose MRSA infection nor to guide or monitor treatment for MRSA infections.   Culture, blood (Routine X 2) w Reflex to ID Panel     Status: None   Collection Time: 07/13/17 10:58 PM  Result Value Ref Range Status   Specimen Description NECK  Final   Special Requests   Final    BOTTLES DRAWN AEROBIC AND ANAEROBIC Blood Culture adequate volume   Culture NO GROWTH 5 DAYS  Final   Report Status 07/18/2017 FINAL  Final  Culture, blood (Routine X 2) w Reflex to ID Panel     Status: None   Collection Time: 07/13/17 11:05 PM  Result Value Ref Range Status   Specimen Description BLOOD RIGHT ARM  Final   Special Requests   Final    BOTTLES DRAWN AEROBIC AND ANAEROBIC Blood Culture adequate volume   Culture NO GROWTH 5 DAYS  Final   Report Status 07/18/2017 FINAL  Final  MRSA PCR Screening     Status: Abnormal   Collection Time: 07/14/17  7:39 AM  Result  Value Ref Range Status   MRSA by PCR INVALID RESULTS, SPECIMEN SENT FOR CULTURE (A) NEGATIVE Final    Comment: HYLTON L. AT [address] BY [address]ON S. ON [id number]        The GeneXpert MRSA Assay (FDA approved for NASAL specimens only), is one component of a comprehensive MRSA colonization surveillance program. It is not intended to diagnose MRSA infection nor to guide or monitor treatment for MRSA infections.   MRSA culture     Status: None   Collection Time: 07/14/17  7:39 AM  Result Value Ref Range Status   Specimen Description NOSE  Final   Special Requests NONE  Final   Culture   Final    NO MRSA DETECTED Performed at [facility], [address] [place], [place] [address]    Report Status 07/15/2017 FINAL  Final     Labs: Basic Metabolic Panel:  Recent Labs Lab 07/15/17 0425 07/16/17 [id number] 07/17/17 0532 07/18/17 0444 07/19/17 0516 07/20/17 0530  NA 135 143 146*  --  152* 152*  K 3.4* 3.1* 3.1*  --  3.8 4.0  CL 96* 105 110  --  120* 121*  CO2 28 27 24   --  26 26  GLUCOSE 98 73 69  --  170* 154*  BUN 41* 41* 39*  --  35* 42*  CREATININE 2.15* 1.72* 1.61* 1.58* 1.33* 1.24  CALCIUM 7.6* 7.8* 7.9*  --  7.7* 7.6*   Liver Function Tests: No results for input(s): AST, ALT, ALKPHOS, BILITOT, PROT, ALBUMIN in the last 168 hours. No results for input(s): LIPASE, AMYLASE in the last 168 hours. No results for input(s): AMMONIA in the last 168 hours. CBC:  Recent Labs Lab 07/17/17 0532 07/17/17 1723 07/18/17 0444 07/18/17 1637 07/19/17 0516  WBC 8.4 8.2 8.4 8.6 8.9  NEUTROABS 6.5 6.5 6.4 6.6 7.0  HGB 8.5* 8.0* 7.6* 7.8* 7.6*  HCT 24.8* 23.2* 22.7* 23.3* 22.3*  MCV 83.2 83.5 84.7 85.7 85.1  PLT 160 158 177 176 200   Cardiac Enzymes:  Recent Labs Lab 07/15/17 0425  TROPONINI 5.76*   D-Dimer No results for input(s): DDIMER in the last 72 hours. BNP: Invalid input(s): POCBNP CBG: No results for input(s): GLUCAP in the last 168 hours. Anemia work up No  results for input(s): VITAMINB12, FOLATE, FERRITIN, TIBC, IRON, RETICCTPCT in the last 72 hours. Urinalysis    Component Value Date/Time   COLORURINE YELLOW 07/23/2017 1636   APPEARANCEUR HAZY (A) 07/20/2017 1636   LABSPEC 1.014 07/15/2017 1636   PHURINE 5.0 07/12/2017 1636   GLUCOSEU NEGATIVE 07/23/2017 1636   HGBUR MODERATE (A) 07/08/2017 1636   BILIRUBINUR NEGATIVE 07/22/2017 1636   KETONESUR NEGATIVE 07/06/2017 1636   PROTEINUR 30 (A) 07/10/2017 1636   NITRITE POSITIVE (A) 06/28/2017 1636   LEUKOCYTESUR LARGE (A) 07/06/2017 1636   Sepsis Labs Invalid input(s): PROCALCITONIN,  WBC,  LACTICIDVEN     SIGNED:  Coralie KeensMauricio Daniel Arrien, MD  [facility] 08/13/17, 2:20 PM Pager   If 7PM-7AM, please contact night-coverage [email or web address] Password [email or web address]

## 2017-07-27 NOTE — Progress Notes (Signed)
Daily Progress Note   Patient Name: Reginald [name]       Date: 07-29-2017 DOB: September 15, 1956  Age: 61 y.o. MRN#: [id number] Attending Physician: Coralie Keens Primary Care Physician: Patient, [name] Per Admit Date: 07/12/2017  Reason for Consultation/Follow-up: Establishing goals of care, Psychosocial/spiritual support and Terminal Care  Subjective: Mr. Levenhagen is lying quietly in bed. He does not respond to voice or touch. There is no family of bedside at this time. He appears relatively comfortable, but is noted to have very shallow breathing with no real air movement.   Mr. Barham was allowed a compassionate extubation 10/25.  He remains relatively comfortable overnight, symptoms are well-managed. He was extubated to room air, but unfortunately, overnight nursing staff administered 2 L oxygen via nasal cannula, which has prolonged his dying process, and thereby his suffering.He has made comfortable without the use of oxygen. Will continue to monitor, but anticipate Hospital death within hours.  Length of Stay: 7  Current Medications: Scheduled Meds:  . Chlorhexidine Gluconate Cloth  6 each Topical Daily    Continuous Infusions: . fentaNYL infusion INTRAVENOUS 150 mcg/hr (07/20/17 1927)    PRN Meds: acetaminophen **OR** acetaminophen, bisacodyl, fentaNYL, LORazepam, polyvinyl alcohol, sodium chloride flush, sodium phosphate  Physical Exam  Constitutional: No distress.  Does not respond to voice her touch, sedated.  HENT:  Head: Atraumatic.  Cardiovascular: Normal rate.   Pulmonary/Chest:  Shallow breathing, minimal breath sounds  Abdominal: Soft. He exhibits no distension.  Musculoskeletal: He exhibits no edema.  Neurological:  Does not respond to voice or touch,  sedated.  Skin: Skin is warm and dry.  Nursing note and vitals reviewed.           Vital Signs: BP (!) 90/54   Pulse (!) 121   Temp (!) 101.1 F (38.4 C) (Axillary)   Resp (!) 21   Ht 5\' 5"  (1.651 m)   Wt 60.1 kg (132 lb 7.9 oz)   SpO2 (!) 87%   BMI 22.05 kg/m  SpO2: SpO2: (!) 87 % O2 Device: O2 Device: Nasal Cannula O2 Flow Rate: O2 Flow Rate (L/min): 1 L/min  Intake/output summary:  Intake/Output Summary (Last 24 hours) at 2017/07/29 1216 Last data filed at 07/20/17 1801  Gross per 24 hour  Intake  1140 ml  Output              650 ml  Net              490 ml   LBM: Last BM Date: 07/20/17 Baseline Weight: Weight: 54.4 kg (120 lb) Most recent weight: Weight: 60.1 kg (132 lb 7.9 oz)       Palliative Assessment/Data:    Flowsheet Rows     Most Recent Value  Intake Tab  Referral Department  Hospitalist  Unit at Time of Referral  ICU  Palliative Care Primary Diagnosis  Cardiac  Date Notified  07/14/17  Palliative Care Type  New Palliative care  Reason for referral  Clarify Goals of Care, End of Life Care Assistance  Date of Admission  07/28/2017  Date first seen by Palliative Care  07/14/17  # of days Palliative referral response time  0 Day(s)  # of days IP prior to Palliative referral  3  Clinical Assessment  Palliative Performance Scale Score  10%  Pain Max last 24 hours  Not able to report  Pain Min Last 24 hours  Not able to report  Dyspnea Max Last 24 Hours  Not able to report  Dyspnea Min Last 24 hours  Not able to report  Psychosocial & Spiritual Assessment  Palliative Care Outcomes  Patient/Family meeting held?  No [LM for DSS SW ]      Patient Active Problem List   Diagnosis Date Noted  . End of life care   . Apnea   . NSTEMI (non-ST elevated myocardial infarction) (HCC)   . Respiratory failure (HCC)   . Palliative care encounter   . Goals of care, counseling/discussion   . Intellectual disability 2017/07/28  . Altered mental status  2017/07/28  . Acute lower UTI 07-28-17  . Dehydration 07-28-2017  . Contractures of both knees 07-28-17  . Debility 2017/07/28  . Bedbound Jul 28, 2017  . Essential hypertension 07-28-2017    Palliative Care Assessment & Plan   Patient Profile: 61 y.o.malewith past medical history of moderate intellectual disabilities, hypertension, dysphagia, muscle contracture since being mostly bedbound/wheelchair-bound since December 2017, anxietyadmitted on 11/02/2018with altered mental status UTI, multisystem organ failure after respiratory arrest.   Assessment: altered mental status UTI, multisystem organ failure after respiratory arrest;the consensus of the medical team is for comfort and dignity at end-of-life, compassionate extubation without reading intubation, let nature take its course. DSS social worker, as of 10/24, states that she is working to [employer].  10/25 legal paperwork allowing for compassionate extubation received, file for chart is in possession. Mr. Suman was extubated 10/25.  He remains relatively comfortable overnight. He was extubated to resume her, but unfortunately overnight nursing staff administered 2 L oxygen via nasal cannula, which has prolonged his dying process, and thereby his suffering.  Recommendations/Plan:  full comfort measures  Goals of Care and Additional Recommendations:  Limitations on Scope of Treatment: Full Comfort Care  Code Status:    Code Status Orders        Start     Ordered   07/14/17 1204  Do not attempt resuscitation (DNR)  Continuous    Question Answer Comment  In the event of cardiac or respiratory ARREST Do not call a "code blue"   In the event of cardiac or respiratory ARREST Do not perform Intubation, CPR, defibrillation or ACLS   In the event of cardiac or respiratory ARREST Use medication by any route, position, wound care, and other measures  to relive pain and suffering. May use oxygen, suction and manual treatment  of airway obstruction as needed for comfort.      07/14/17 1203    Code Status History    Date Active Date Inactive Code Status Order ID Comments User Context   05-Oct-2016 10:10 PM 07/14/2017 12:03 PM Full Code [id number]  Delano MetzSchertz, Robert, MD Inpatient       Prognosis:   Hours - Days  Discharge Planning:  Anticipated Hospital Death  Care plan was discussed with nursing staff, case manager, social worker, and Dr. Ella JubileeArrien.   Thank you for allowing the Palliative Medicine Team to assist in the care of this patient.   Time In: 0940 Time Out: 1020 Total Time 40 minutes Prolonged Time Billed  no       Greater than 50%  of this time was spent counseling and coordinating care related to the above assessment and plan.  Katheran Aweasha A Dove, NP  Please contact Palliative Medicine Team phone at [phone] for questions and concerns.

## 2017-07-27 NOTE — Progress Notes (Signed)
Nutrition Brief Note  Compassionate extubation order received and completed yesterday. Tube feedings have been discontinued and patient appears comfortable.   Labs and medications reviewed.   No nutrition interventions warranted at this time.    Royann ShiversLynn Nabiha Planck MS,RD,CSG,LDN Office: [phone] Pager: [phone]

## 2017-07-27 DEATH — deceased

## 2019-08-10 IMAGING — CT CT CHEST W/O CM
2 of 4 series · 14 of 36 positions shown, 17 images · non-contrast
Comparison: None.

CLINICAL DATA: Abnormal liver function tests.  Possible aspiration.

EXAM:
CT CHEST, ABDOMEN AND PELVIS WITHOUT CONTRAST
TECHNIQUE: Multidetector CT imaging of the chest, abdomen and pelvis was
performed following the standard protocol without IV contrast.

[Series 2: axial st · axial · 0.80mm/px · z∈[+812,+1386]mm · 11 of 137 slices shown, 14 images]
[im 11/137  mediastinal]
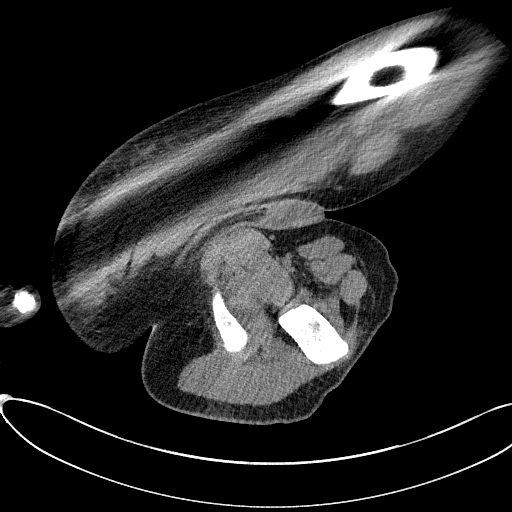
[im 11/137  lung]
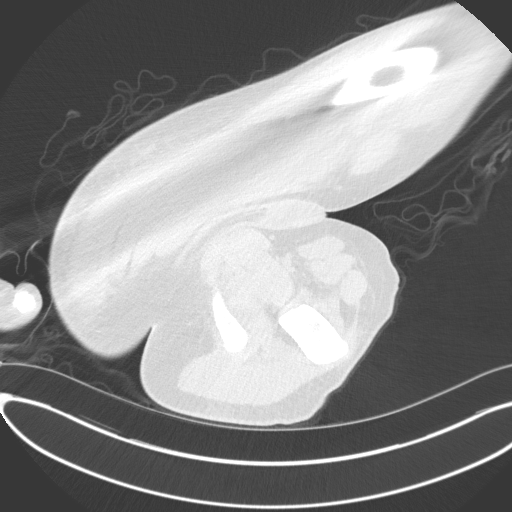
[im 21/137  lung]
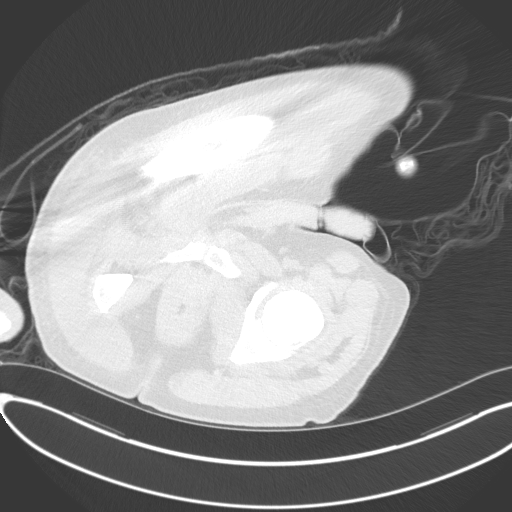
[im 32/137  lung]
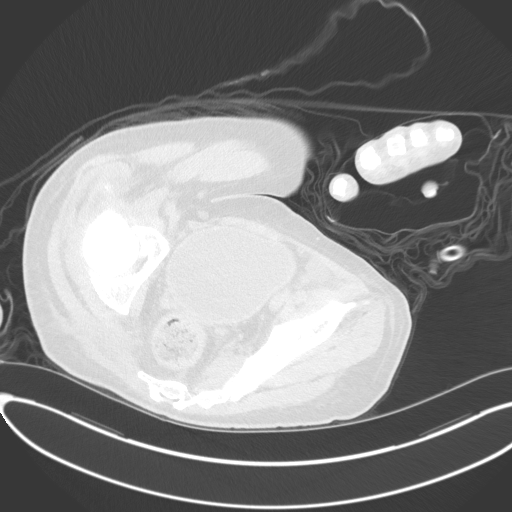
[im 42/137  lung]
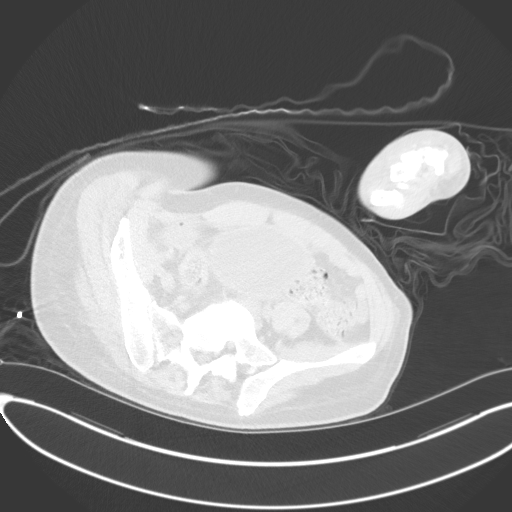
[im 53/137  mediastinal]
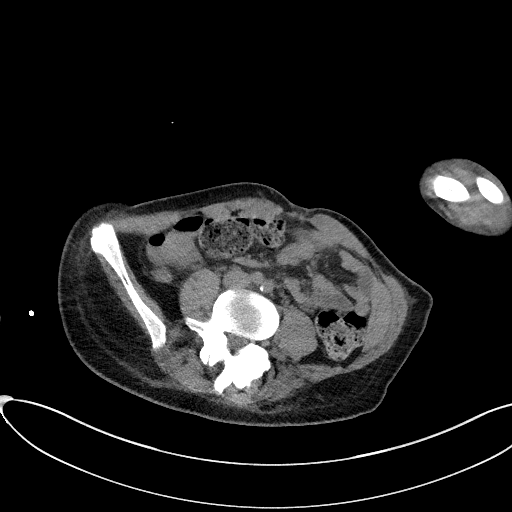
[im 53/137  lung]
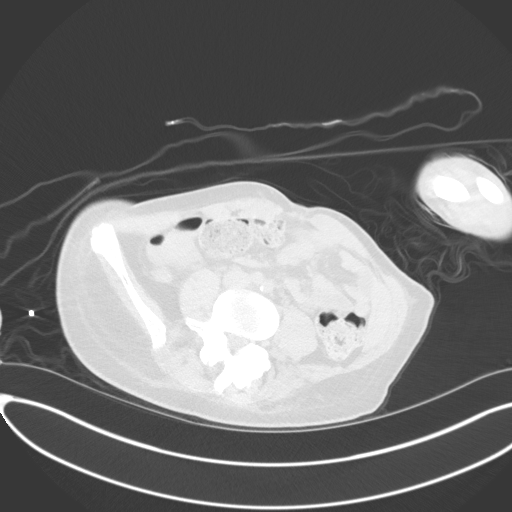
[im 74/137  lung]
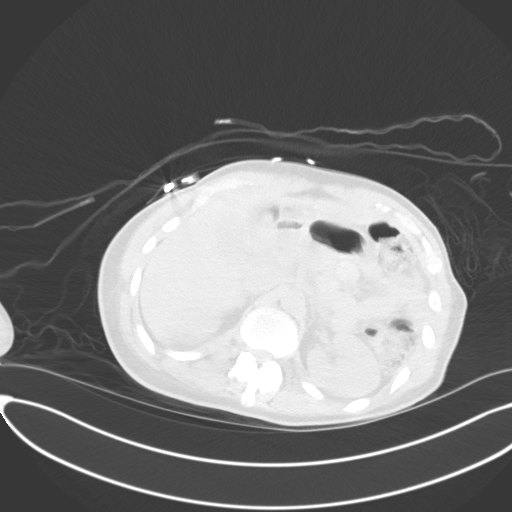
[im 84/137  lung]
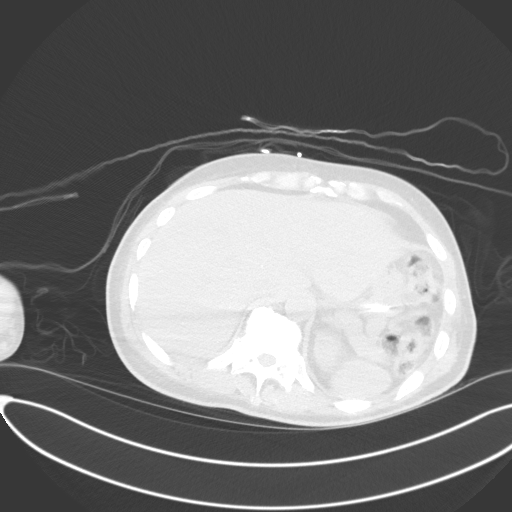
[im 95/137  lung]
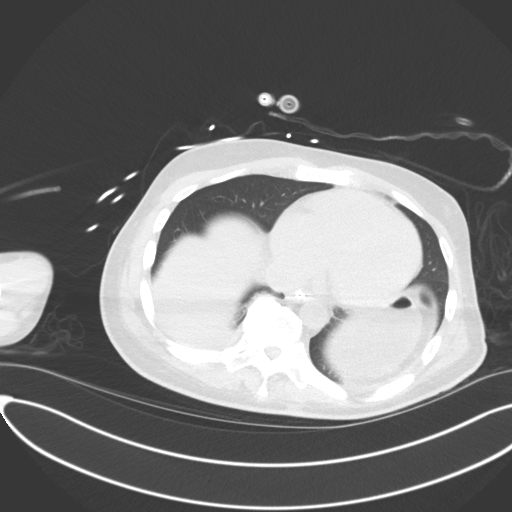
[im 105/137  mediastinal]
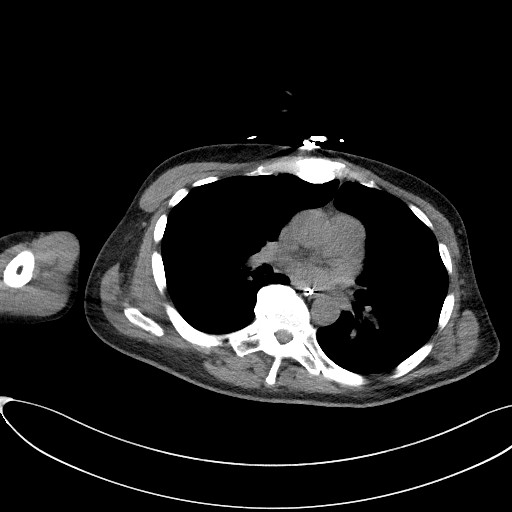
[im 105/137  lung]
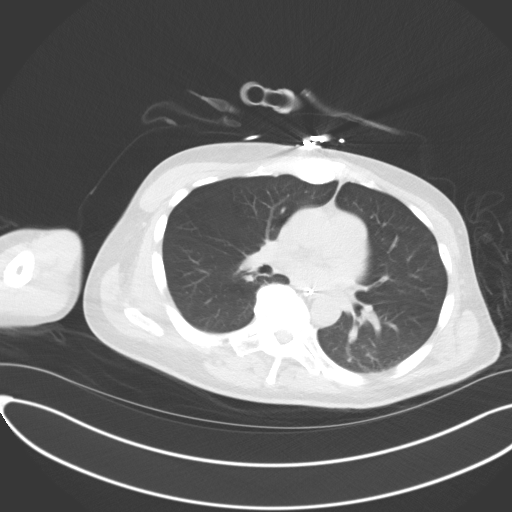
[im 116/137  lung]
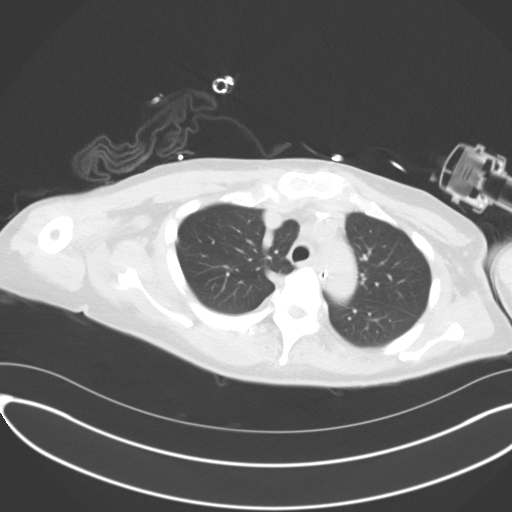
[im 126/137  lung]
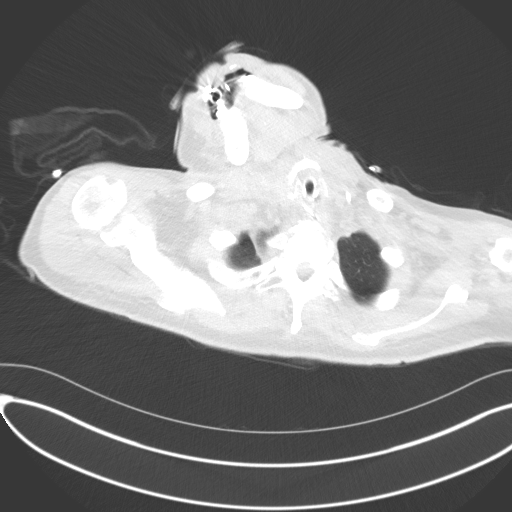

[Series 5: coronal · coronal · 0.81mm/px · 3 of 151 slices shown]
[im 31/151  lung]
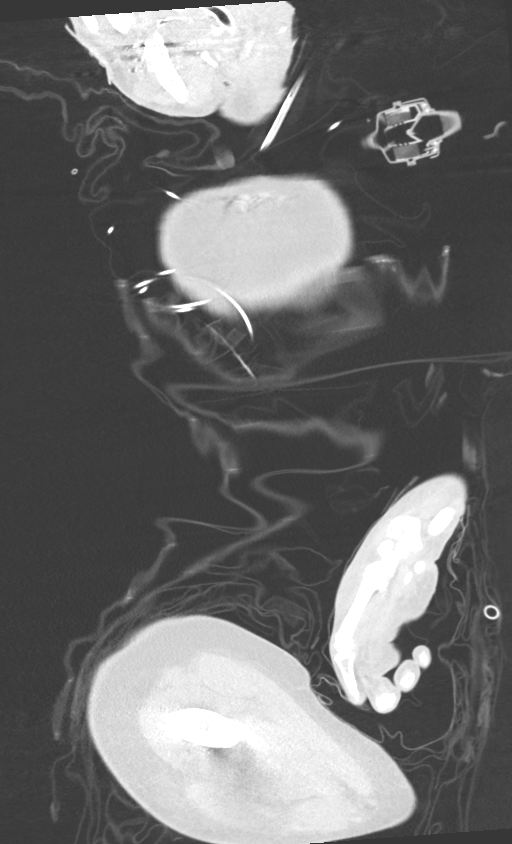
[im 61/151  lung]
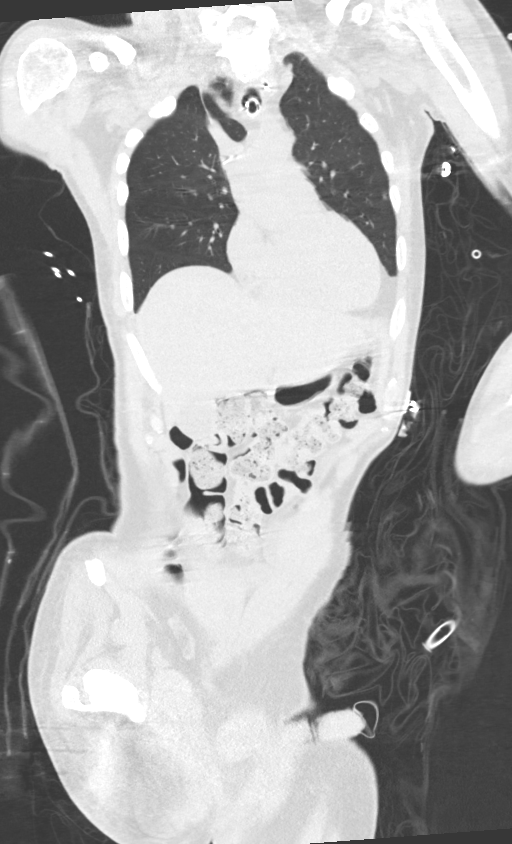
[im 91/151  lung]
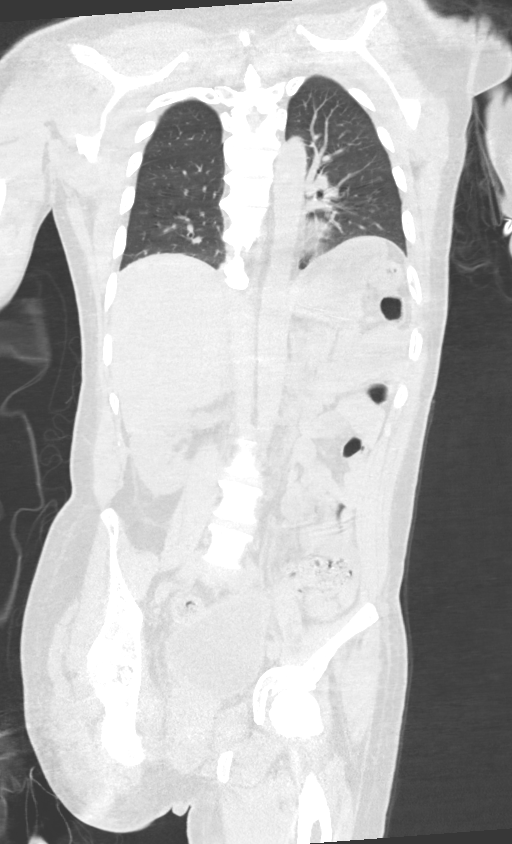

[14 of 36 positions shown; findings below may reference images not displayed]

FINDINGS: CT CHEST FINDINGS

Cardiovascular: Heart is normal size. Aorta is normal caliber.
Scattered aortic arch calcifications.

Mediastinum/Nodes: No mediastinal, hilar, or axillary adenopathy.
Endotracheal tube is in place with the tip in the mid to lower
trachea.

Lungs/Pleura: Dependent bibasilar opacities bilaterally, right
greater than left, favor atelectasis. Trace right pleural effusion.

Musculoskeletal: No acute bony abnormality.

CT ABDOMEN PELVIS FINDINGS

Hepatobiliary: No focal hepatic abnormality. Gallbladder
unremarkable.

Pancreas: No focal abnormality or ductal dilatation.

Spleen: No focal abnormality.  Normal size.

Adrenals/Urinary Tract: No adrenal abnormality. No focal renal
abnormality. No stones or hydronephrosis. Urinary bladder is
unremarkable.

Stomach/Bowel: Moderate to large stool burden throughout the colon.
No evidence of bowel obstruction. Appendix is normal.

Vascular/Lymphatic: No evidence of aneurysm or adenopathy.

Reproductive: No visible focal abnormality.

Other: No free fluid or free air.

Musculoskeletal: No acute bony abnormality. Degenerative facet
disease in the lumbar spine.
IMPRESSION: Trace right pleural effusion with dependent opacities in both lung
bases, favor dependent atelectasis.

Large stool burden throughout the colon.

Normal appendix.

No acute findings in the chest, no acute findings in the abdomen or
pelvis.

## 2019-08-11 IMAGING — CR DG CHEST 1V PORT
1 series · 1 of 1 positions shown · non-contrast
Comparison: 07/14/2017

CLINICAL DATA: Respiratory difficulty, follow-up effusion

EXAM:
PORTABLE CHEST 1 VIEW

[ap]
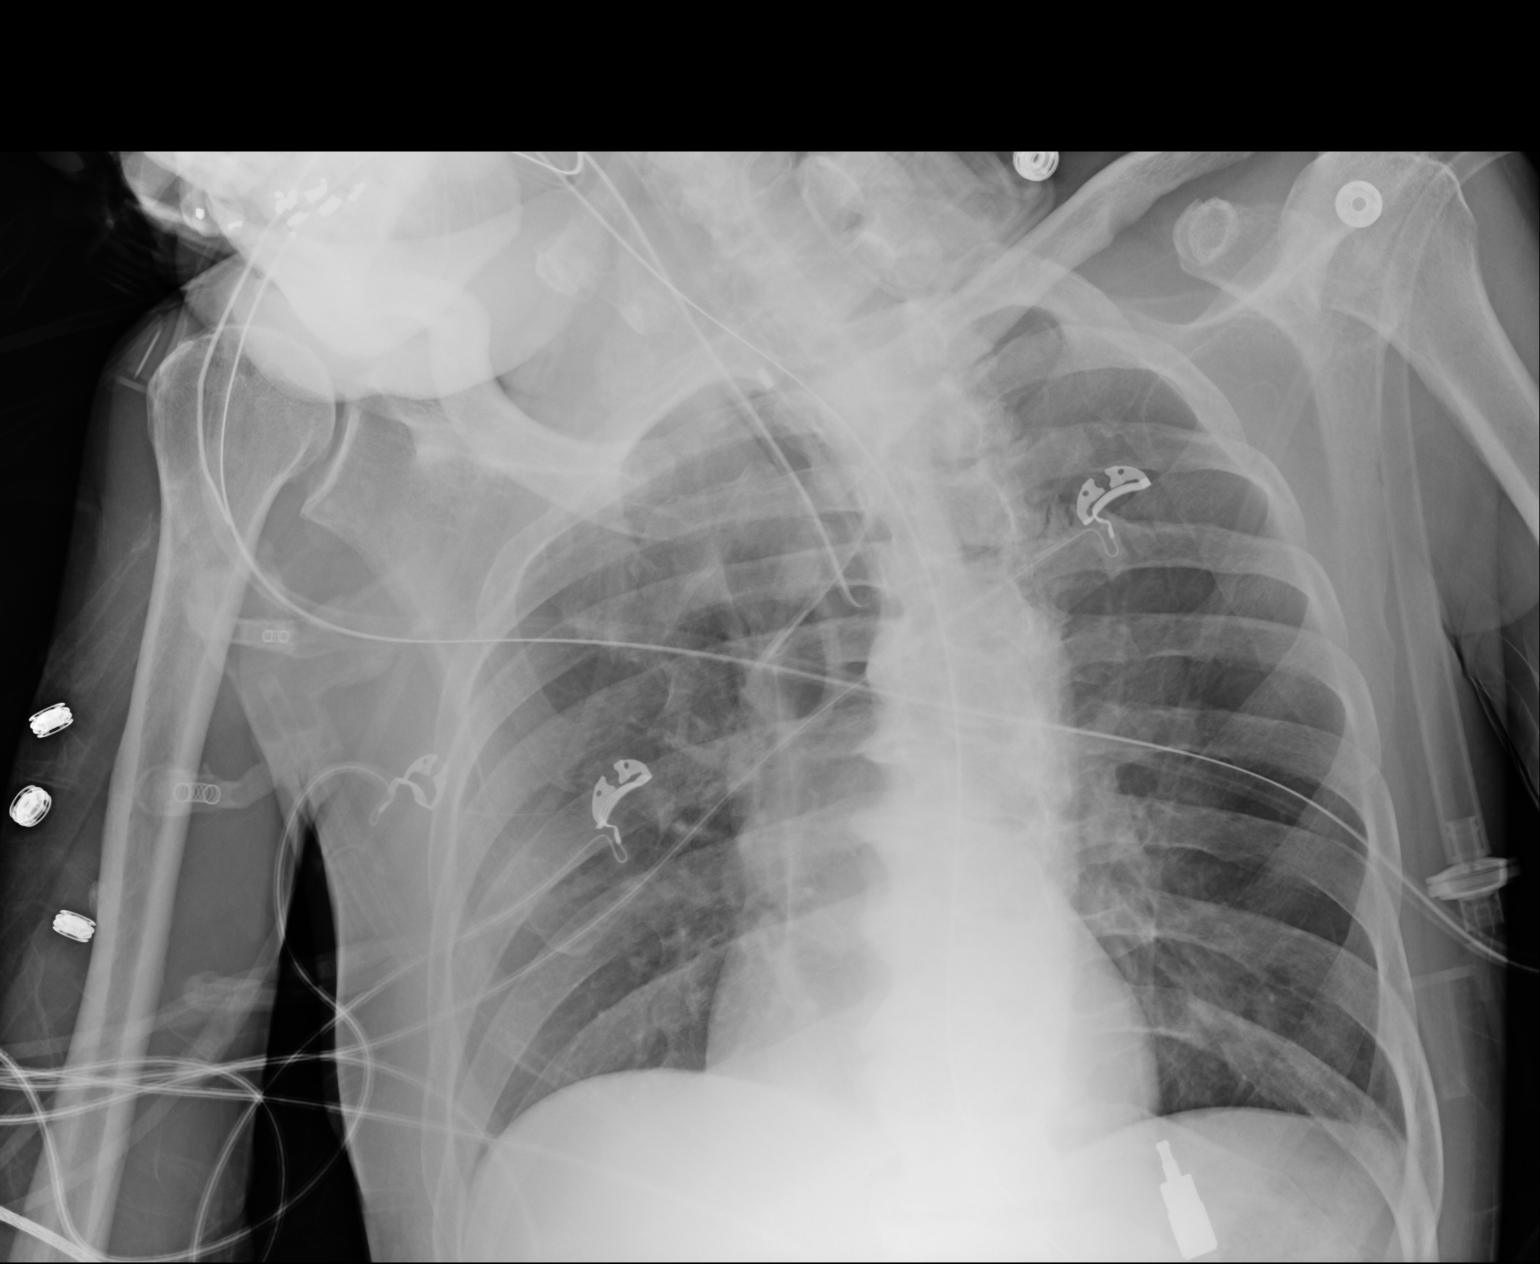

[1 of 1 positions shown; findings below may reference images not displayed]

FINDINGS: Cardiac shadow is stable. Endotracheal tube and orogastric catheter
are noted within the stomach. Patient is rotated accentuating the
mediastinal markings. Left jugular central line is again seen. The
lungs are clear bilaterally no bony abnormality is seen.
IMPRESSION: No acute abnormality noted.

Tubes and lines as described.
